# Patient Record
Sex: Male | Born: 1945 | Race: White | Hispanic: No | Marital: Married | Smoking: Never smoker
Health system: Southern US, Community
[De-identification: ages and names within clinical notes are randomized; demographics above are authoritative.]

## PROBLEM LIST (undated history)

## (undated) DIAGNOSIS — M779 Enthesopathy, unspecified: Secondary | ICD-10-CM

## (undated) DIAGNOSIS — K219 Gastro-esophageal reflux disease without esophagitis: Secondary | ICD-10-CM

## (undated) DIAGNOSIS — K603 Anal fistula, unspecified: Secondary | ICD-10-CM

## (undated) DIAGNOSIS — I2510 Atherosclerotic heart disease of native coronary artery without angina pectoris: Secondary | ICD-10-CM

## (undated) DIAGNOSIS — Z8719 Personal history of other diseases of the digestive system: Secondary | ICD-10-CM

## (undated) DIAGNOSIS — I10 Essential (primary) hypertension: Secondary | ICD-10-CM

## (undated) DIAGNOSIS — Z8669 Personal history of other diseases of the nervous system and sense organs: Secondary | ICD-10-CM

## (undated) DIAGNOSIS — M1990 Unspecified osteoarthritis, unspecified site: Secondary | ICD-10-CM

## (undated) DIAGNOSIS — Z973 Presence of spectacles and contact lenses: Secondary | ICD-10-CM

## (undated) DIAGNOSIS — I493 Ventricular premature depolarization: Secondary | ICD-10-CM

## (undated) DIAGNOSIS — N281 Cyst of kidney, acquired: Secondary | ICD-10-CM

## (undated) HISTORY — PX: CARDIOVASCULAR STRESS TEST: SHX262

## (undated) HISTORY — PX: TONSILLECTOMY: SUR1361

## (undated) HISTORY — PX: CATARACT EXTRACTION W/ INTRAOCULAR LENS  IMPLANT, BILATERAL: SHX1307

---

## 1999-01-21 ENCOUNTER — Emergency Department (HOSPITAL_COMMUNITY): Admission: EM | Admit: 1999-01-21 | Discharge: 1999-01-21 | Payer: Self-pay | Admitting: Emergency Medicine

## 1999-01-21 ENCOUNTER — Encounter: Payer: Self-pay | Admitting: Emergency Medicine

## 2004-04-25 ENCOUNTER — Encounter: Admission: RE | Admit: 2004-04-25 | Discharge: 2004-04-25 | Payer: Self-pay | Admitting: Internal Medicine

## 2004-04-25 IMAGING — CT CT ABDOMEN W/ CM
1 of 2 series · 15 of 32 positions shown, 19 images · IV contrast (gastro & omnipaque [ID])
Comparison: none

CLINICAL DATA: Abdominal pain, particularly left upper quadrant.  
 CT ABDOMEN WITH CONTRAST:
 Multidetector helical scans through the abdomen were performed after oral and IV contrast media were given.  100 cc of Omnipaque 300 were given as the contrast media.
 The lung bases are clear.  The liver enhances normally with no focal abnormality, and no ductal dilatation is seen.  No calcified gallstones are noted. The pancreas is normal in size with normal peripancreatic fat planes.  The adrenal glands appear normal, however, there are multiple rounded, low attenuation structures involving the kidneys, left greater than right, most consistent with cysts.  No definite solid renal lesion is seen.  The spleen is normal in size. The abdominal aorta is normal in caliber.   No adenopathy is seen.  The appendix is visualized in the right lower quadrant and fills normally with air and contrast.  No abnormality of the bowel is seen.  Minimal irregularity is noted to one or two posterior left lower ribs, which could represent nondisplaced fractures.

[Series 2: — · axial · 0.70mm/px · z∈[-268,+27]mm · 15 of 65 slices shown, 19 images]
[im 3/65  soft-tissue]
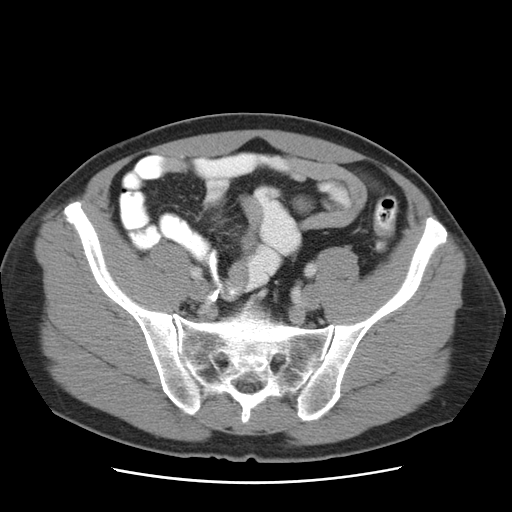
[im 3/65  bone]
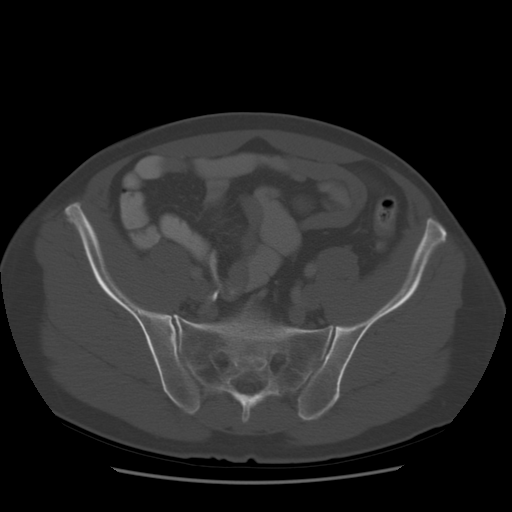
[im 9/65  soft-tissue]
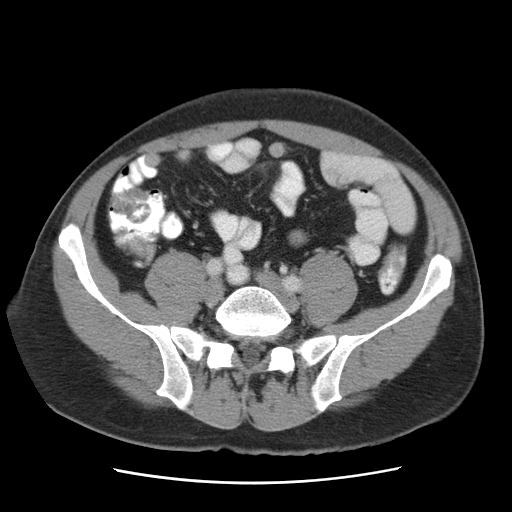
[im 15/65  soft-tissue]
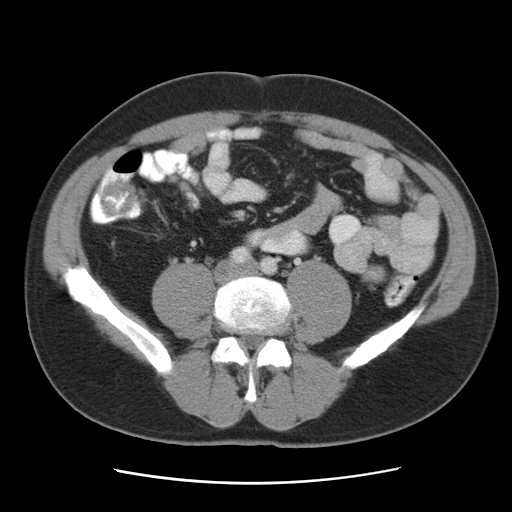
[im 18/65  soft-tissue]
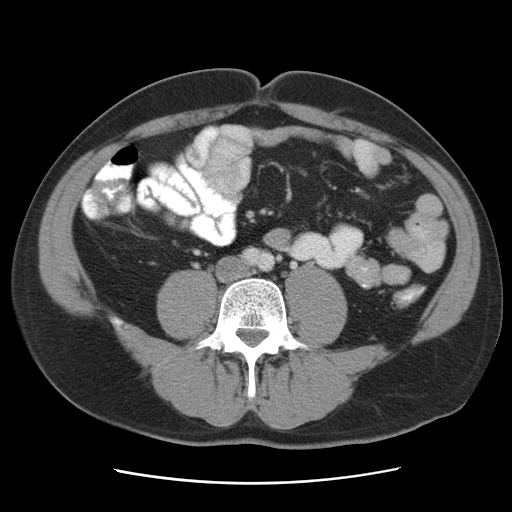
[im 24/65  soft-tissue]
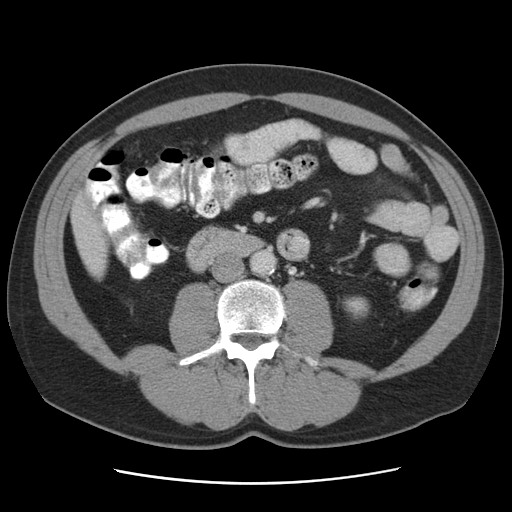
[im 27/65  soft-tissue]
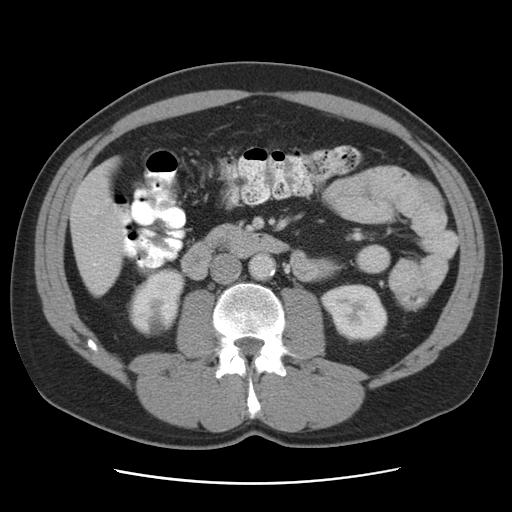
[im 33/65  soft-tissue]
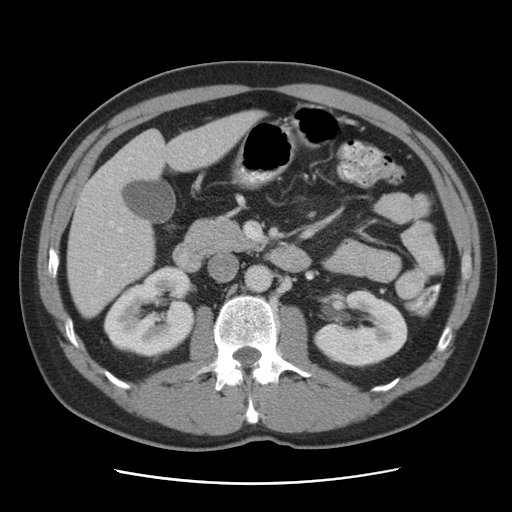
[im 38/65  soft-tissue]
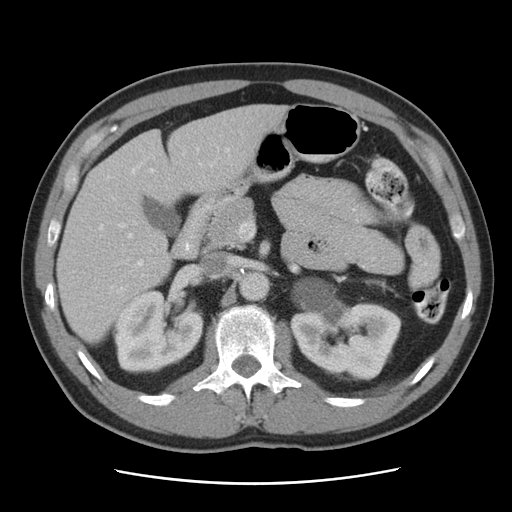
[im 41/65  soft-tissue]
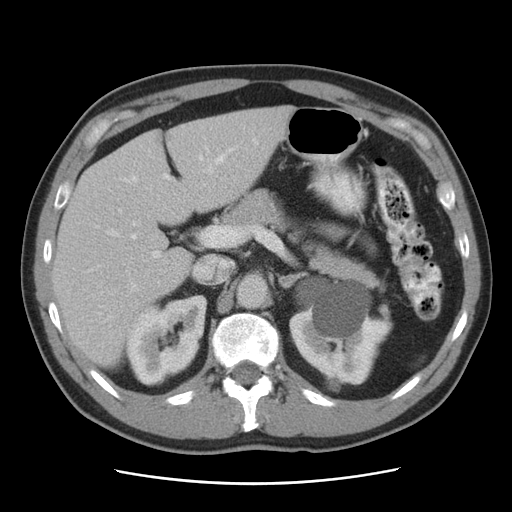
[im 41/65  bone]
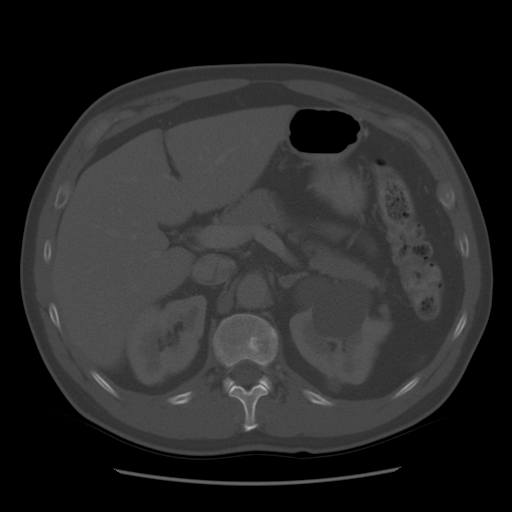
[im 47/65  soft-tissue]
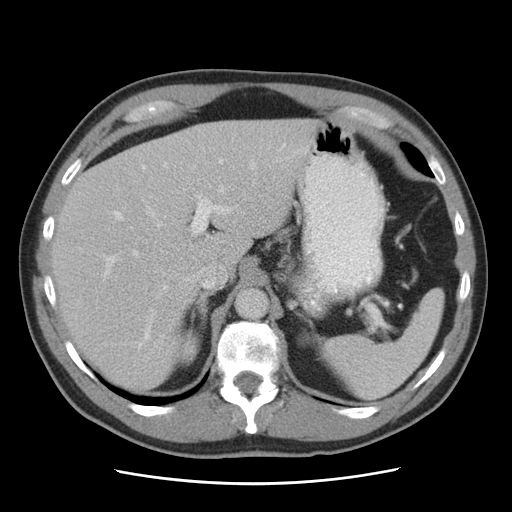
[im 50/65  soft-tissue]
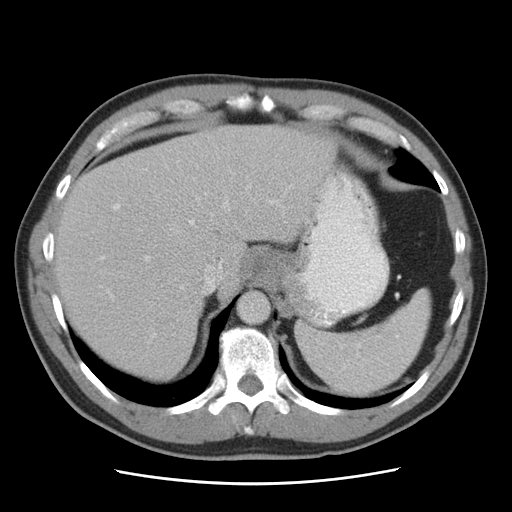
[im 53/65  lung]
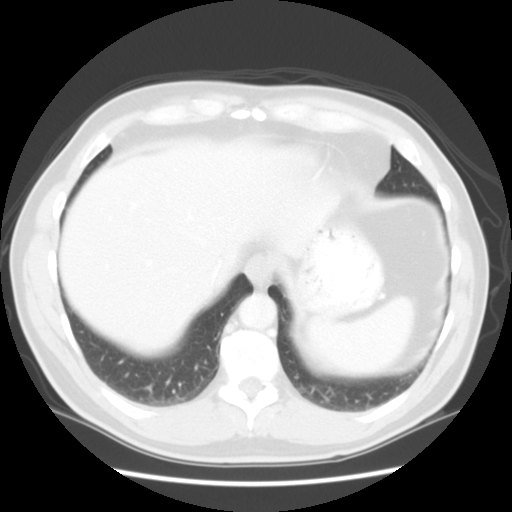
[im 56/65  soft-tissue]
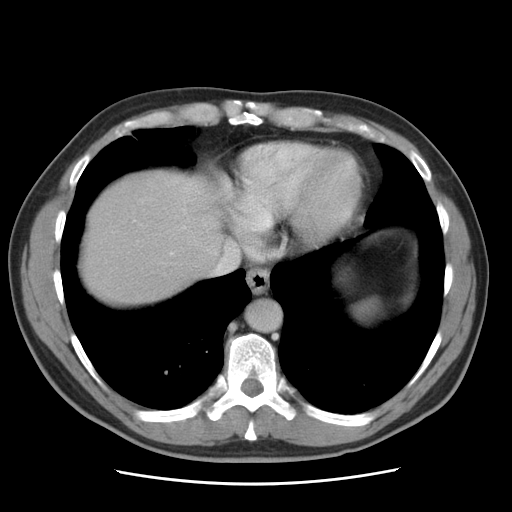
[im 56/65  lung]
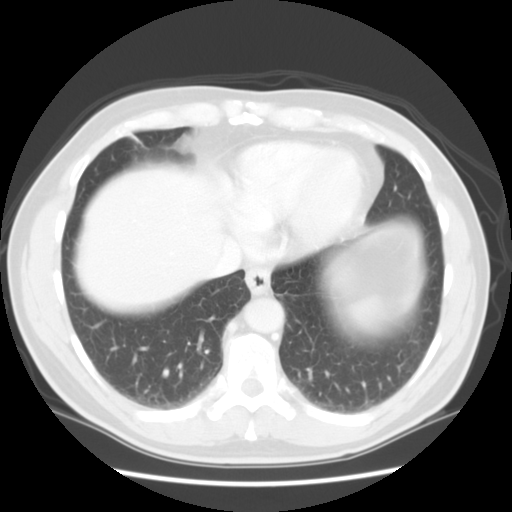
[im 59/65  lung]
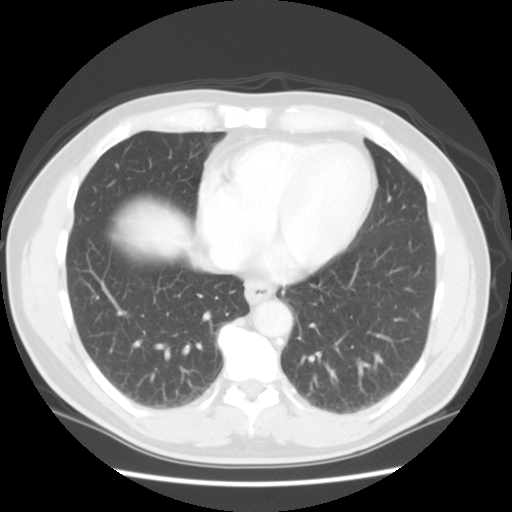
[im 62/65  soft-tissue]
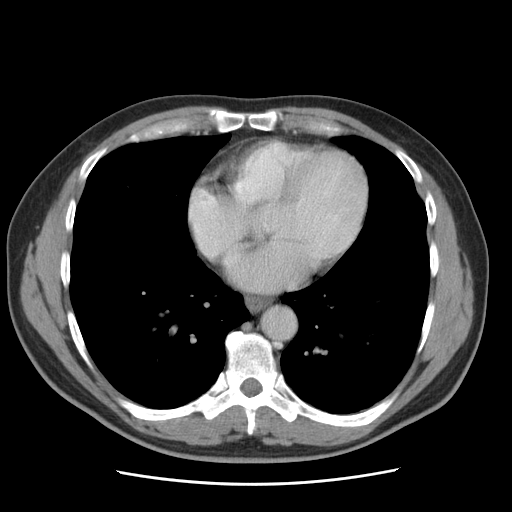
[im 62/65  lung]
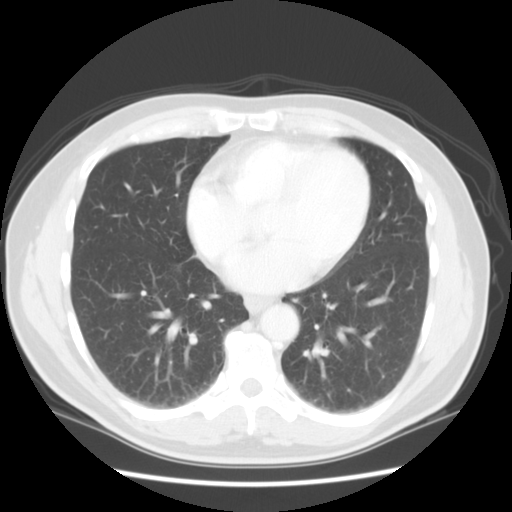

[15 of 32 positions shown; findings below may reference images not displayed]

IMPRESSION: 1.   No acute intraabdominal abnormality.
 2.  Multiple renal cysts, left greater than right, in size and number.
 3.  Cannot exclude nondisplaced left lower posterior rib fracture of uncertain age.

## 2011-10-01 ENCOUNTER — Other Ambulatory Visit: Payer: Self-pay | Admitting: Internal Medicine

## 2013-09-09 HISTORY — PX: EYE SURGERY: SHX253

## 2013-10-05 ENCOUNTER — Other Ambulatory Visit: Payer: Self-pay | Admitting: Internal Medicine

## 2013-10-05 ENCOUNTER — Ambulatory Visit
Admission: RE | Admit: 2013-10-05 | Discharge: 2013-10-05 | Disposition: A | Discharge status: Home / Self Care | Payer: Medicare Other | Source: Ambulatory Visit | Attending: Internal Medicine | Admitting: Internal Medicine

## 2013-10-05 DIAGNOSIS — M25512 Pain in left shoulder: Secondary | ICD-10-CM

## 2013-10-08 ENCOUNTER — Emergency Department (HOSPITAL_COMMUNITY): Payer: Medicare Other

## 2013-10-08 ENCOUNTER — Encounter (HOSPITAL_COMMUNITY): Payer: Self-pay | Admitting: Emergency Medicine

## 2013-10-08 ENCOUNTER — Observation Stay (HOSPITAL_COMMUNITY)
Admission: EM | Admit: 2013-10-08 | Discharge: 2013-10-09 | Disposition: A | Discharge status: Home / Self Care | Payer: Medicare Other | Attending: Internal Medicine | Admitting: Internal Medicine

## 2013-10-08 DIAGNOSIS — G454 Transient global amnesia: Secondary | ICD-10-CM | POA: Diagnosis not present

## 2013-10-08 DIAGNOSIS — R413 Other amnesia: Secondary | ICD-10-CM | POA: Diagnosis present

## 2013-10-08 DIAGNOSIS — I10 Essential (primary) hypertension: Secondary | ICD-10-CM | POA: Insufficient documentation

## 2013-10-08 DIAGNOSIS — K219 Gastro-esophageal reflux disease without esophagitis: Secondary | ICD-10-CM | POA: Diagnosis not present

## 2013-10-08 DIAGNOSIS — R42 Dizziness and giddiness: Secondary | ICD-10-CM | POA: Insufficient documentation

## 2013-10-08 DIAGNOSIS — F440 Dissociative amnesia: Secondary | ICD-10-CM

## 2013-10-08 DIAGNOSIS — E785 Hyperlipidemia, unspecified: Secondary | ICD-10-CM

## 2013-10-08 DIAGNOSIS — D72829 Elevated white blood cell count, unspecified: Secondary | ICD-10-CM

## 2013-10-08 DIAGNOSIS — I639 Cerebral infarction, unspecified: Secondary | ICD-10-CM

## 2013-10-08 DIAGNOSIS — E871 Hypo-osmolality and hyponatremia: Secondary | ICD-10-CM | POA: Diagnosis present

## 2013-10-08 HISTORY — DX: Enthesopathy, unspecified: M77.9

## 2013-10-08 HISTORY — DX: Essential (primary) hypertension: I10

## 2013-10-08 HISTORY — DX: Gastro-esophageal reflux disease without esophagitis: K21.9

## 2013-10-08 LAB — DIFFERENTIAL
Basophils Absolute: 0 10*3/uL (ref 0.0–0.1)
Basophils Relative: 0 % (ref 0–1)
Eosinophils Absolute: 0 10*3/uL (ref 0.0–0.7)
Eosinophils Relative: 0 % (ref 0–5)
Lymphocytes Relative: 12 % (ref 12–46)
Lymphs Abs: 1.4 10*3/uL (ref 0.7–4.0)
MONOS PCT: 7 % (ref 3–12)
Monocytes Absolute: 0.9 10*3/uL (ref 0.1–1.0)
NEUTROS PCT: 81 % — AB (ref 43–77)
Neutro Abs: 9.5 10*3/uL — ABNORMAL HIGH (ref 1.7–7.7)

## 2013-10-08 LAB — CBC
HCT: 43.4 % (ref 39.0–52.0)
Hemoglobin: 15.3 g/dL (ref 13.0–17.0)
MCH: 33.6 pg (ref 26.0–34.0)
MCHC: 35.3 g/dL (ref 30.0–36.0)
MCV: 95.4 fL (ref 78.0–100.0)
PLATELETS: 333 10*3/uL (ref 150–400)
RBC: 4.55 MIL/uL (ref 4.22–5.81)
RDW: 11.9 % (ref 11.5–15.5)
WBC: 11.8 10*3/uL — AB (ref 4.0–10.5)

## 2013-10-08 LAB — COMPREHENSIVE METABOLIC PANEL
ALT: 51 U/L (ref 0–53)
AST: 35 U/L (ref 0–37)
Albumin: 4.5 g/dL (ref 3.5–5.2)
Alkaline Phosphatase: 79 U/L (ref 39–117)
Anion gap: 16 — ABNORMAL HIGH (ref 5–15)
BUN: 20 mg/dL (ref 6–23)
CALCIUM: 9.9 mg/dL (ref 8.4–10.5)
CO2: 23 meq/L (ref 19–32)
CREATININE: 0.92 mg/dL (ref 0.50–1.35)
Chloride: 95 mEq/L — ABNORMAL LOW (ref 96–112)
GFR, EST NON AFRICAN AMERICAN: 86 mL/min — AB (ref >90)
Glucose, Bld: 119 mg/dL — ABNORMAL HIGH (ref 70–99)
Potassium: 4 mEq/L (ref 3.7–5.3)
SODIUM: 134 meq/L — AB (ref 137–147)
TOTAL PROTEIN: 7.8 g/dL (ref 6.0–8.3)
Total Bilirubin: 0.8 mg/dL (ref 0.3–1.2)

## 2013-10-08 LAB — I-STAT CHEM 8, ED
BUN: 20 mg/dL (ref 6–23)
Calcium, Ion: 1.16 mmol/L (ref 1.13–1.30)
Chloride: 100 mEq/L (ref 96–112)
Creatinine, Ser: 1 mg/dL (ref 0.50–1.35)
Glucose, Bld: 117 mg/dL — ABNORMAL HIGH (ref 70–99)
HEMATOCRIT: 49 % (ref 39.0–52.0)
Hemoglobin: 16.7 g/dL (ref 13.0–17.0)
POTASSIUM: 3.8 meq/L (ref 3.7–5.3)
Sodium: 135 mEq/L — ABNORMAL LOW (ref 137–147)
TCO2: 24 mmol/L (ref 0–100)

## 2013-10-08 LAB — URINALYSIS, ROUTINE W REFLEX MICROSCOPIC
Bilirubin Urine: NEGATIVE
Glucose, UA: NEGATIVE mg/dL
Hgb urine dipstick: NEGATIVE
KETONES UR: NEGATIVE mg/dL
LEUKOCYTES UA: NEGATIVE
NITRITE: NEGATIVE
Protein, ur: NEGATIVE mg/dL
Specific Gravity, Urine: 1.028 (ref 1.005–1.030)
Urobilinogen, UA: 0.2 mg/dL (ref 0.0–1.0)
pH: 6 (ref 5.0–8.0)

## 2013-10-08 LAB — RAPID URINE DRUG SCREEN, HOSP PERFORMED
Amphetamines: NOT DETECTED
BARBITURATES: NOT DETECTED
Benzodiazepines: NOT DETECTED
Cocaine: NOT DETECTED
Opiates: POSITIVE — AB
Tetrahydrocannabinol: NOT DETECTED

## 2013-10-08 LAB — ETHANOL: ALCOHOL ETHYL (B): 13 mg/dL — AB (ref 0–11)

## 2013-10-08 LAB — I-STAT TROPONIN, ED: Troponin i, poc: 0.01 ng/mL (ref 0.00–0.08)

## 2013-10-08 LAB — PROTIME-INR
INR: 0.83 (ref 0.00–1.49)
PROTHROMBIN TIME: 11.4 s — AB (ref 11.6–15.2)

## 2013-10-08 LAB — CBG MONITORING, ED: Glucose-Capillary: 116 mg/dL — ABNORMAL HIGH (ref 70–99)

## 2013-10-08 LAB — TROPONIN I: Troponin I: <0.3 ng/mL (ref <0.30)

## 2013-10-08 LAB — APTT: APTT: 29 s (ref 24–37)

## 2013-10-08 MED ORDER — ENOXAPARIN SODIUM 40 MG/0.4ML ~~LOC~~ SOLN
40.0000 mg | SUBCUTANEOUS | Status: DC
  Administered 2013-10-08: 40 mg via SUBCUTANEOUS
  Filled 2013-10-08 (×2): qty 0.4

## 2013-10-08 MED ORDER — NAPROXEN SODIUM 220 MG PO TABS: 220.0000 mg | ORAL_TABLET | Freq: Two times a day (BID) | ORAL | Status: DC | PRN

## 2013-10-08 MED ORDER — LOSARTAN POTASSIUM 50 MG PO TABS
100.0000 mg | ORAL_TABLET | Freq: Every day | ORAL | Status: DC
  Administered 2013-10-09: 100 mg via ORAL
  Filled 2013-10-08: qty 2

## 2013-10-08 MED ORDER — NAPROXEN 250 MG PO TABS
250.0000 mg | ORAL_TABLET | Freq: Two times a day (BID) | ORAL | Status: DC | PRN
  Filled 2013-10-08: qty 1

## 2013-10-08 MED ORDER — PANTOPRAZOLE SODIUM 40 MG PO TBEC
40.0000 mg | DELAYED_RELEASE_TABLET | Freq: Every day | ORAL | Status: DC
  Administered 2013-10-09: 40 mg via ORAL
  Filled 2013-10-08: qty 1

## 2013-10-08 MED ORDER — STROKE: EARLY STAGES OF RECOVERY BOOK
Freq: Once | Status: AC
  Administered 2013-10-09: 08:00:00
  Filled 2013-10-08 (×2): qty 1

## 2013-10-08 MED ORDER — ASPIRIN 325 MG PO TABS
325.0000 mg | ORAL_TABLET | Freq: Every day | ORAL | Status: DC
  Administered 2013-10-08 – 2013-10-09 (×2): 325 mg via ORAL
  Filled 2013-10-08 (×2): qty 1

## 2013-10-08 MED ORDER — SENNOSIDES-DOCUSATE SODIUM 8.6-50 MG PO TABS
2.0000 | ORAL_TABLET | Freq: Every evening | ORAL | Status: DC | PRN
  Filled 2013-10-08: qty 2

## 2013-10-08 MED ORDER — ASPIRIN 300 MG RE SUPP
300.0000 mg | Freq: Every day | RECTAL | Status: DC
  Filled 2013-10-08: qty 1

## 2013-10-08 MED ORDER — LOSARTAN POTASSIUM-HCTZ 100-12.5 MG PO TABS: 1.0000 | ORAL_TABLET | Freq: Every morning | ORAL | Status: DC

## 2013-10-08 MED ORDER — SODIUM CHLORIDE 0.9 % IV SOLN
INTRAVENOUS | Status: AC
  Administered 2013-10-08: 21:00:00 via INTRAVENOUS

## 2013-10-08 MED ORDER — HYDROCHLOROTHIAZIDE 12.5 MG PO CAPS
12.5000 mg | ORAL_CAPSULE | Freq: Every day | ORAL | Status: DC
  Administered 2013-10-09: 12.5 mg via ORAL
  Filled 2013-10-08: qty 1

## 2013-10-08 NOTE — ED Notes (Signed)
Patient transported to CT 

## 2013-10-08 NOTE — ED Notes (Addendum)
Family member states about 20 mins ago, pt began to have memory loss of immediate information , face turning red and excessive sweating. Pt states he just feels lightheaded. Family member states pt has asked about his wallet 10 times since they have been here and she has answered every time.  Pt strength equal bilaterally in upper and lower extremities, face symmetrical, no drift in upper or lower extremities.

## 2013-10-08 NOTE — ED Notes (Signed)
Attempted to call report, RN unavailable. Floor RN to call back 

## 2013-10-08 NOTE — ED Notes (Signed)
[facility] called for transport.  

## 2013-10-08 NOTE — ED Notes (Signed)
MD at bedside. 

## 2013-10-08 NOTE — ED Provider Notes (Signed)
CSN: [id number]     Arrival date & time 10/08/13  1417 History   First MD Initiated Contact with Patient 10/08/13 1452     Chief Complaint  Patient presents with  . Memory Loss  . Dizziness  . Excessive Sweating     (Consider location/radiation/quality/duration/timing/severity/associated sxs/prior Treatment) HPI 68 year old male with history of hypertension without history of stroke or confusion previously was last known well at 2:00 this afternoon when he was at home talking to his wife and he developed sudden short-term memory loss consistent with global amnesia suddenly not recalling orientation to time or recent history for the last few days. Patient has no headache no change in speech vision swallowing or understanding and no focal or lateralizing weakness numbness incoordination or vertigo. He has clear speech and is walking normally. There is no treatment prior to arrival. Past Medical History  Diagnosis Date  . Hypertension   . GERD (gastroesophageal reflux disease)   . Retinal detachment   . Tendonitis     left shoulder   Past Surgical History  Procedure Laterality Date  . Eye surgery  09/09/2013    retinal detachment surgery  . Tonsillectomy    . Left shoulder injection  10/07/13   Family History  Problem Relation Age of Onset  . Stroke Neg Hx   . Heart disease Father   . Depression Mother     with previous suicide attempt  . Bipolar disorder Brother   . Alcoholism Paternal Uncle   . Cancer Neg Hx    History  Substance Use Topics  . Smoking status: Never Smoker   . Smokeless tobacco: Not on file  . Alcohol Use: Yes     Comment: 2-3 beers per day and glass of wine    Review of Systems  10 Systems reviewed and are negative for acute change except as noted in the HPI.  Allergies  Review of patient's allergies indicates no known allergies.  Home Medications   Prior to Admission medications   Medication Sig Start Date End Date Taking? Authorizing Provider   losartan-hydrochlorothiazide (HYZAAR) 100-12.5 MG per tablet Take 1 tablet by mouth every morning.   Yes Historical Provider, MD  naproxen sodium (ANAPROX) 220 MG tablet Take 220 mg by mouth 2 (two) times daily as needed (pain).   Yes Historical Provider, MD  omeprazole (PRILOSEC) 20 MG capsule Take 20 mg by mouth every morning.   Yes Historical Provider, MD  aspirin EC 81 MG tablet Take 1 tablet (81 mg total) by mouth daily. 10/09/13   Ripudeep Jenna Luo, MD  indomethacin (INDOCIN) 50 MG capsule Take 50 mg by mouth 2 (two) times daily with a meal.    Historical Provider, MD  simvastatin (ZOCOR) 5 MG tablet Take 1 tablet (5 mg total) by mouth at bedtime. 10/09/13   Ripudeep Jenna Luo, MD  traMADol (ULTRAM) 50 MG tablet Take 1 tablet (50 mg total) by mouth every 8 (eight) hours as needed for severe pain. 10/09/13   Ripudeep Jenna Luo, MD   BP 158/85  Pulse 59  Temp(Src) 99.3 F (37.4 C) (Oral)  Resp 20  Ht 5\' 9"  (1.753 m)  Wt 167 lb 12.3 oz (76.1 kg)  BMI 24.76 kg/m2  SpO2 100% Physical Exam  Nursing note and vitals reviewed. Constitutional:  Awake, alert, nontoxic appearance with baseline speech for patient.  HENT:  Head: Atraumatic.  Mouth/Throat: No oropharyngeal exudate.  Eyes: EOM are normal. Pupils are equal, round, and reactive to light. Right eye  exhibits no discharge. Left eye exhibits no discharge.  Neck: Neck supple.  Cardiovascular: Normal rate and regular rhythm.   No murmur heard. Pulmonary/Chest: Effort normal and breath sounds normal. No stridor. No respiratory distress. He has no wheezes. He has no rales. He exhibits no tenderness.  Abdominal: Soft. Bowel sounds are normal. He exhibits no mass. There is no tenderness. There is no rebound.  Musculoskeletal: He exhibits no tenderness.  Baseline ROM, moves extremities with no obvious new focal weakness.  Lymphadenopathy:    He has no cervical adenopathy.  Neurological: He is alert.  Awake, alert, cooperative and aware of situation;  motor strength 5/5 bilaterally; sensation normal to light touch bilaterally; peripheral visual fields full to confrontation; no facial asymmetry; tongue midline; major cranial nerves appear intact; no pronator drift, normal finger to nose bilaterally, baseline gait without new ataxia. Patient is oriented to person and place but not to time but he does realize he has short-term memory loss. He has no idea what day of the week it is what month it is. He does not recall events for the last several days.  Skin: No rash noted.  Psychiatric: He has a normal mood and affect.    ED Course  Procedures (including critical care time) D/w [facility] rec[facility] at [facility]; agrees likely transient global amnesia; not Code Stroke tPA candidate. 1510 D/w Pt and family [facility] paged. 1720 Patient / Family / Caregiver informed of clinical course, understand medical decision-making process, and agree with plan.Pt stable in ED with no significant deterioration in condition. Labs Review Labs Reviewed  COMPREHENSIVE METABOLIC PANEL - Abnormal; Notable for the following:    Sodium 134 (*)    Chloride 95 (*)    Glucose, Bld 119 (*)    GFR calc non Af Amer 86 (*)    Anion gap 16 (*)    All other components within normal limits  CBC - Abnormal; Notable for the following:    WBC 11.8 (*)    All other components within normal limits  ETHANOL - Abnormal; Notable for the following:    Alcohol, Ethyl (B) 13 (*)    All other components within normal limits  PROTIME-INR - Abnormal; Notable for the following:    Prothrombin Time 11.4 (*)    All other components within normal limits  URINE RAPID DRUG SCREEN (HOSP PERFORMED) - Abnormal; Notable for the following:    Opiates POSITIVE (*)    All other components within normal limits  URINALYSIS, ROUTINE W REFLEX MICROSCOPIC - Abnormal; Notable for the following:    Color, Urine AMBER (*)    All other components within normal limits  DIFFERENTIAL - Abnormal; Notable for the  following:    Neutrophils Relative % 81 (*)    Neutro Abs 9.5 (*)    All other components within normal limits  HEMOGLOBIN A1C - Abnormal; Notable for the following:    Hemoglobin A1C 5.7 (*)    Mean Plasma Glucose 117 (*)    All other components within normal limits  BASIC METABOLIC PANEL - Abnormal; Notable for the following:    Glucose, Bld 112 (*)    GFR calc non Af Amer 87 (*)    All other components within normal limits  CBC - Abnormal; Notable for the following:    RBC 4.15 (*)    All other components within normal limits  LIPID PANEL - Abnormal; Notable for the following:    Cholesterol 251 (*)    Triglycerides 233 (*)  VLDL 47 (*)    LDL Cholesterol 127 (*)    All other components within normal limits  CBG MONITORING, ED - Abnormal; Notable for the following:    Glucose-Capillary 116 (*)    All other components within normal limits  I-STAT CHEM 8, ED - Abnormal; Notable for the following:    Sodium 135 (*)    Glucose, Bld 117 (*)    All other components within normal limits  APTT  TROPONIN I  TROPONIN I  TROPONIN I  I-STAT TROPOININ, ED  [place] SensorI-STAT TROPOININ, ED    Imaging Review Dg Chest 2 View  10/09/2013   CLINICAL DATA:  Stroke  EXAM: CHEST  2 VIEW  COMPARISON:  None.  FINDINGS: Cardiomediastinal silhouette is unremarkable. No acute infiltrate or pleural effusion. No pulmonary edema. Degenerative changes thoracic spine. Probable bilateral basilar nodular nipple shadow. Repeat frontal view with nipple markers is recommended for confirmation.  IMPRESSION: No acute infiltrate or pleural effusion. No pulmonary edema. Degenerative changes thoracic spine. Probable bilateral basilar nodular nipple shadow. Repeat frontal view with nipple markers is recommended for confirmation.   Electronically Signed   By: Natasha MeadLiviu  Pop M.D.   On: 10/09/2013 09:57   Ct Head Wo Contrast  10/08/2013   CLINICAL DATA:  Memory loss, dizziness  EXAM: CT HEAD WITHOUT CONTRAST  TECHNIQUE: Contiguous  axial images were obtained from the base of the skull through the vertex without intravenous contrast.  COMPARISON:  None  FINDINGS: There is no evidence of mass effect, midline shift, or extra-axial fluid collections. There is no evidence of a space-occupying lesion or intracranial hemorrhage. There is no evidence of a cortical-based area of acute infarction. There is generalized cerebral atrophy. There is periventricular white matter low attenuation likely secondary to microangiopathy.  The ventricles and sulci are appropriate for the patient's age. The basal cisterns are patent.  Visualized portions of the orbits are unremarkable. The visualized portions of the paranasal sinuses and mastoid air cells are unremarkable.  The osseous structures are unremarkable.  IMPRESSION: No acute intracranial pathology.   Electronically Signed   By: Elige KoHetal  Patel   On: 10/08/2013 15:31   Mr Brain Wo Contrast  10/09/2013   CLINICAL DATA:  Episode of memory loss.  Possible TIA.  EXAM: MRI HEAD WITHOUT CONTRAST  MRA HEAD WITHOUT CONTRAST  TECHNIQUE: Multiplanar, multiecho pulse sequences of the brain and surrounding structures were obtained without intravenous contrast. Angiographic images of the head were obtained using MRA technique without contrast.  COMPARISON:  CT head without contrast 10/08/2013  FINDINGS: MRI HEAD FINDINGS  The diffusion-weighted images demonstrate no evidence for acute or subacute infarction. No hemorrhage or mass lesion is present. Midline structures are normal. Mild atrophy and minimal periventricular white matter changes are noted bilaterally. The ventricles are of normal size. No significant extraaxial fluid collection is present.  Flow is present in the major intracranial arteries. The paranasal sinuses and mastoid air cells are clear. The patient is status post bilateral lens replacements.  MRA HEAD FINDINGS  The internal carotid arteries are within normal limits from the high cervical segments  through the ICA termini bilaterally. The A1 and M1 segments are normal. The anterior communicating artery is present. The MCA bifurcations are intact. There is some attenuation of distal MCA branch vessels bilaterally. No significant proximal stenosis or occlusion is present.  The right vertebral artery is the dominant vessel. The right PICA origin is visualized and normal. The left AICA is dominant. The basilar artery is  within normal limits. Both posterior cerebral arteries originate from the basilar tip. There is some attenuation of distal PCA branch vessels.  IMPRESSION: 1. No acute intracranial abnormality. 2. Mild small vessel disease evident with T2 periventricular T2 changes and distal attenuation of branch vessels on the MRA. 3. No significant proximal stenosis, aneurysm, or branch vessel occlusion.   Electronically Signed   By: Gennette Pac M.D.   On: 10/09/2013 13:52   Mr Maxine Glenn Head/brain Wo Cm  10/09/2013   CLINICAL DATA:  Episode of memory loss.  Possible TIA.  EXAM: MRI HEAD WITHOUT CONTRAST  MRA HEAD WITHOUT CONTRAST  TECHNIQUE: Multiplanar, multiecho pulse sequences of the brain and surrounding structures were obtained without intravenous contrast. Angiographic images of the head were obtained using MRA technique without contrast.  COMPARISON:  CT head without contrast 10/08/2013  FINDINGS: MRI HEAD FINDINGS  The diffusion-weighted images demonstrate no evidence for acute or subacute infarction. No hemorrhage or mass lesion is present. Midline structures are normal. Mild atrophy and minimal periventricular white matter changes are noted bilaterally. The ventricles are of normal size. No significant extraaxial fluid collection is present.  Flow is present in the major intracranial arteries. The paranasal sinuses and mastoid air cells are clear. The patient is status post bilateral lens replacements.  MRA HEAD FINDINGS  The internal carotid arteries are within normal limits from the high cervical  segments through the ICA termini bilaterally. The A1 and M1 segments are normal. The anterior communicating artery is present. The MCA bifurcations are intact. There is some attenuation of distal MCA branch vessels bilaterally. No significant proximal stenosis or occlusion is present.  The right vertebral artery is the dominant vessel. The right PICA origin is visualized and normal. The left AICA is dominant. The basilar artery is within normal limits. Both posterior cerebral arteries originate from the basilar tip. There is some attenuation of distal PCA branch vessels.  IMPRESSION: 1. No acute intracranial abnormality. 2. Mild small vessel disease evident with T2 periventricular T2 changes and distal attenuation of branch vessels on the MRA. 3. No significant proximal stenosis, aneurysm, or branch vessel occlusion.   Electronically Signed   By: Gennette Pac M.D.   On: 10/09/2013 13:52     EKG Interpretation   Date/Time:  Thursday October 08 2013 14:32:04 EDT Ventricular Rate:  68 PR Interval:  156 QRS Duration: 104 QT Interval:  390 QTC Calculation: 415 R Axis:   -35 Text Interpretation:  Sinus rhythm Left axis deviation No previous ECGs  available Confirmed by [facility]  MD, Jonny Ruiz ([id number]) on 10/08/2013 3:20:52 PM      MDM   Final diagnoses:  Global amnesia    The patient appears reasonably stabilized for transfer considering the current resources, flow, and capabilities available in the ED at this time, and I doubt any other [facility] requiring further screening and/or treatment in the ED prior to transfer.    Hurman Horn, MD 10/09/13 2113

## 2013-10-08 NOTE — Consult Note (Signed)
NEURO HOSPITALIST CONSULT NOTE    Reason for Consult: transient memory loss  HPI:                                                                                                                                          Adam Mckenzie is an 68 y.o. male with a past medical history significant for HTN, retinal detachment, GERD, transferred to [facility] after sustaining a transient episode of memory dysfunction. He was initially evaluated at [facility] and sent to this hospital for further evaluation and management. Clinical data is gathered from the chart as Adam Mckenzie said that he doesn't recalls what happened this afternoon, " I have a short gap in my memory". He remembers telling his wife that he was feeling " weird" which he believes was enough to make his wife very concerned.  " History is obtained mostly from his wife he states that he was on the phone with [place] tax office trying to sort out tax payments around 2 PM. He had a normal conversation on the phone, however when he hung up and she asked him what they discussed, and she had a long pause and then finally answered that he did not remember. He was flushed and diaphoretic. He did not have any facial droop, slurred speech, weakness of an arm or leg. His wife realized that he did not know the month, year, dogs name, or the fact that he had a steroid injection of his left shoulder yesterday. She brought him urgently to the emergency department because of concern for stroke. Initial assessment in the emergency department was consistent with her report. He did not remember any events over the last several weeks. Initially, he repeatedly asked his wife why he had IVs placed in his arm, what his armband was, where he was, etc. According to his wife, he has had extraordinary stress over the last year with the loss of his job, difficulty with finances, living next to his mother who has some mental health issues. He has not had any problems  previously with drug use however for the last 2 days he has been taking hydrocodone. He drinks approximately 3 alcoholic beverages per day and has never had withdrawal from alcohol. He had 2 sips of line with lunch prior to making his phone call to [place] and did not have any signs of alcohol intoxication at the time that this happened." Initial work up in the ED: unrevealing serologies and CT head unremarkable. At this moment, he denies HA, vertigo, double vision, focal weakness or numbness, slurred speech, language or vision impairment.   Past Medical History  Diagnosis Date  . Hypertension   . GERD (gastroesophageal reflux disease)   . Retinal detachment   . Tendonitis  left shoulder    Past Surgical History  Procedure Laterality Date  . Eye surgery  09/09/2013    retinal detachment surgery  . Tonsillectomy    . Left shoulder injection  10/07/13    Family History  Problem Relation Age of Onset  . Stroke Neg Hx   . Heart disease Father   . Depression Mother     with previous suicide attempt  . Bipolar disorder Brother   . Alcoholism Paternal Uncle   . Cancer Neg Hx     Social History:  reports that he has never smoked. He does not have any smokeless tobacco history on file. He reports that he drinks alcohol. He reports that he does not use illicit drugs.  No Known Allergies  MEDICATIONS:                                                                                                                     Scheduled: .  stroke: mapping our early stages of recovery book   Does not apply Once  . aspirin  300 mg Rectal Daily   Or  . aspirin  325 mg Oral Daily  . enoxaparin (LOVENOX) injection  40 mg Subcutaneous Q24H  . [START ON 10/09/2013] losartan  100 mg Oral Daily   And  . [START ON 10/09/2013] hydrochlorothiazide  12.5 mg Oral Daily  . [START ON 10/09/2013] pantoprazole  40 mg Oral Daily     ROS:                                                                                                                                        History obtained from chart review and patient.  General ROS: negative for - chills, fatigue, fever, night sweats, weight gain or weight loss Psychological ROS: negative for - behavioral disorder, hallucinations, memory difficulties, mood swings or suicidal ideation Ophthalmic ROS: negative for - blurry vision, double vision, eye pain or loss of vision ENT ROS: negative for - epistaxis, nasal discharge, oral lesions, sore throat, tinnitus or vertigo Allergy and Immunology ROS: negative for - hives or itchy/watery eyes Hematological and Lymphatic ROS: negative for - bleeding problems, bruising or swollen lymph nodes Endocrine ROS: negative for - galactorrhea, hair pattern changes, polydipsia/polyuria or temperature intolerance Respiratory ROS: negative for - cough, hemoptysis, shortness of breath or wheezing Cardiovascular ROS: negative for - chest pain, dyspnea on exertion, edema or  irregular heartbeat Gastrointestinal ROS: negative for - abdominal pain, diarrhea, hematemesis, nausea/vomiting or stool incontinence Genito-Urinary ROS: negative for - dysuria, hematuria, incontinence or urinary frequency/urgency Musculoskeletal ROS: negative for - joint swelling or muscular weakness Neurological ROS: as noted in HPI Dermatological ROS: negative for rash and skin lesion changes  Physical exam: pleasant male in no apparent distress. Blood pressure 137/70, pulse 67, temperature 98.7 F (37.1 C), temperature source Oral, resp. rate 18, height 5\' 9"  (1.753 m), weight 76.1 kg (167 lb 12.3 oz), SpO2 96.00%. Head: normocephalic. Neck: supple, no bruits, no JVD. Cardiac: no murmurs. Lungs: clear. Abdomen: soft, no tender, no mass. Extremities: no edema. Neurologic Examination:                                                                                                      Mental Status: Alert, oriented, thought content appropriate.   Speech fluent without evidence of aphasia.  Able to follow 3 step commands without difficulty. Cranial Nerves: II: Discs flat bilaterally; Visual fields grossly normal, pupils equal, round, reactive to light and accommodation III,IV, VI: ptosis not present, extra-ocular motions intact bilaterally V,VII: smile symmetric, facial light touch sensation normal bilaterally VIII: hearing normal bilaterally IX,X: gag reflex present XI: bilateral shoulder shrug XII: midline tongue extension without atrophy or fasciculations  Motor: Right : Upper extremity   5/5    Left:     Upper extremity   5/5  Lower extremity   5/5     Lower extremity   5/5 Tone and bulk:normal tone throughout; no atrophy noted Sensory: Pinprick and light touch intact throughout, bilaterally Deep Tendon Reflexes:  Right: Upper Extremity   Left: Upper extremity   biceps (C-5 to C-6) 2/4   biceps (C-5 to C-6) 2/4 tricep (C7) 2/4    triceps (C7) 2/4 Brachioradialis (C6) 2/4  Brachioradialis (C6) 2/4  Lower Extremity Lower Extremity  quadriceps (L-2 to L-4) 2/4   quadriceps (L-2 to L-4) 2/4 Achilles (S1) 2/4   Achilles (S1) 2/4  Plantars: Right: downgoing   Left: downgoing Cerebellar: normal finger-to-nose,  normal heel-to-shin test Gait:  No tested    No results found for this basename: cbc, bmp, coags, chol, tri, ldl, hga1c    Results for orders placed during the hospital encounter of 10/08/13 (from the past 48 hour(s))  CBG MONITORING, ED     Status: Abnormal   Collection Time    10/08/13  2:30 PM      Result Value Ref Range   Glucose-Capillary 116 (*) 70 - 99 mg/dL   Comment 1 Documented in Chart     Comment 2 Notify RN    COMPREHENSIVE METABOLIC PANEL     Status: Abnormal   Collection Time    10/08/13  2:49 PM      Result Value Ref Range   Sodium 134 (*) 137 - 147 mEq/L   Potassium 4.0  3.7 - 5.3 mEq/L   Chloride 95 (*) 96 - 112 mEq/L   CO2 23  19 - 32 mEq/L   Glucose, Bld 119 (*) 70 - 99  mg/dL   BUN  20  6 - 23 mg/dL   Creatinine, Ser 0.92  0.50 - 1.35 mg/dL   Calcium 9.9  8.4 - 10.5 mg/dL   Total Protein 7.8  6.0 - 8.3 g/dL   Albumin 4.5  3.5 - 5.2 g/dL   AST 35  0 - 37 U/L   ALT 51  0 - 53 U/L   Alkaline Phosphatase 79  39 - 117 U/L   Total Bilirubin 0.8  0.3 - 1.2 mg/dL   GFR calc non Af Amer 86 (*) >90 mL/min   GFR calc Af Amer >90  >90 mL/min   Comment: (NOTE)     The eGFR has been calculated using the CKD EPI equation.     This calculation has not been validated in all clinical situations.     eGFR's persistently <90 mL/min signify possible Chronic Kidney     Disease.   Anion gap 16 (*) 5 - 15  CBC     Status: Abnormal   Collection Time    10/08/13  2:49 PM      Result Value Ref Range   WBC 11.8 (*) 4.0 - 10.5 K/uL   RBC 4.55  4.22 - 5.81 MIL/uL   Hemoglobin 15.3  13.0 - 17.0 g/dL   HCT 43.4  39.0 - 52.0 %   MCV 95.4  78.0 - 100.0 fL   MCH 33.6  26.0 - 34.0 pg   MCHC 35.3  30.0 - 36.0 g/dL   RDW 11.9  11.5 - 15.5 %   Platelets 333  150 - 400 K/uL  DIFFERENTIAL     Status: Abnormal   Collection Time    10/08/13  2:49 PM      Result Value Ref Range   Neutrophils Relative % 81 (*) 43 - 77 %   Neutro Abs 9.5 (*) 1.7 - 7.7 K/uL   Lymphocytes Relative 12  12 - 46 %   Lymphs Abs 1.4  0.7 - 4.0 K/uL   Monocytes Relative 7  3 - 12 %   Monocytes Absolute 0.9  0.1 - 1.0 K/uL   Eosinophils Relative 0  0 - 5 %   Eosinophils Absolute 0.0  0.0 - 0.7 K/uL   Basophils Relative 0  0 - 1 %   Basophils Absolute 0.0  0.0 - 0.1 K/uL  ETHANOL     Status: Abnormal   Collection Time    10/08/13  2:50 PM      Result Value Ref Range   Alcohol, Ethyl (B) 13 (*) 0 - 11 mg/dL   Comment:            LOWEST DETECTABLE LIMIT FOR     SERUM ALCOHOL IS 11 mg/dL     FOR MEDICAL PURPOSES ONLY  PROTIME-INR     Status: Abnormal   Collection Time    10/08/13  2:50 PM      Result Value Ref Range   Prothrombin Time 11.4 (*) 11.6 - 15.2 seconds   INR 0.83  0.00 - 1.49  APTT     Status: None    Collection Time    10/08/13  2:50 PM      Result Value Ref Range   aPTT 29  24 - 37 seconds  I-STAT TROPOININ, ED     Status: None   Collection Time    10/08/13  3:09 PM      Result Value Ref Range   Troponin i, poc 0.01  0.00 -  0.08 ng/mL   Comment 3            Comment: Due to the release kinetics of cTnI,     a negative result within the first hours     of the onset of symptoms does not rule out     myocardial infarction with certainty.     If myocardial infarction is still suspected,     repeat the test at appropriate intervals.  I-STAT CHEM 8, ED     Status: Abnormal   Collection Time    10/08/13  3:10 PM      Result Value Ref Range   Sodium 135 (*) 137 - 147 mEq/L   Potassium 3.8  3.7 - 5.3 mEq/L   Chloride 100  96 - 112 mEq/L   BUN 20  6 - 23 mg/dL   Creatinine, Ser 1.00  0.50 - 1.35 mg/dL   Glucose, Bld 117 (*) 70 - 99 mg/dL   Calcium, Ion 1.16  1.13 - 1.30 mmol/L   TCO2 24  0 - 100 mmol/L   Hemoglobin 16.7  13.0 - 17.0 g/dL   HCT 49.0  39.0 - 52.0 %  URINE RAPID DRUG SCREEN (HOSP PERFORMED)     Status: Abnormal   Collection Time    10/08/13  4:42 PM      Result Value Ref Range   Opiates POSITIVE (*) NONE DETECTED   Cocaine NONE DETECTED  NONE DETECTED   Benzodiazepines NONE DETECTED  NONE DETECTED   Amphetamines NONE DETECTED  NONE DETECTED   Tetrahydrocannabinol NONE DETECTED  NONE DETECTED   Barbiturates NONE DETECTED  NONE DETECTED   Comment:            DRUG SCREEN FOR MEDICAL PURPOSES     ONLY.  IF CONFIRMATION IS NEEDED     FOR ANY PURPOSE, NOTIFY LAB     WITHIN 5 DAYS.                LOWEST DETECTABLE LIMITS     FOR URINE DRUG SCREEN     Drug Class       Cutoff (ng/mL)     Amphetamine      1000     Barbiturate      200     Benzodiazepine   545     Tricyclics       625     Opiates          300     Cocaine          300     THC              50  URINALYSIS, ROUTINE W REFLEX MICROSCOPIC     Status: Abnormal   Collection Time    10/08/13  4:42 PM       Result Value Ref Range   Color, Urine AMBER (*) YELLOW   Comment: BIOCHEMICALS MAY BE AFFECTED BY COLOR   APPearance CLEAR  CLEAR   Specific Gravity, Urine 1.028  1.005 - 1.030   pH 6.0  5.0 - 8.0   Glucose, UA NEGATIVE  NEGATIVE mg/dL   Hgb urine dipstick NEGATIVE  NEGATIVE   Bilirubin Urine NEGATIVE  NEGATIVE   Ketones, ur NEGATIVE  NEGATIVE mg/dL   Protein, ur NEGATIVE  NEGATIVE mg/dL   Urobilinogen, UA 0.2  0.0 - 1.0 mg/dL   Nitrite NEGATIVE  NEGATIVE   Leukocytes, UA NEGATIVE  NEGATIVE   Comment: MICROSCOPIC NOT DONE ON URINES  WITH NEGATIVE PROTEIN, BLOOD, LEUKOCYTES, NITRITE, OR GLUCOSE <1000 mg/dL.  TROPONIN I     Status: None   Collection Time    10/08/13  9:25 PM      Result Value Ref Range   Troponin I <0.30  <0.30 ng/mL   Comment:            Due to the release kinetics of cTnI,     a negative result within the first hours     of the onset of symptoms does not rule out     myocardial infarction with certainty.     If myocardial infarction is still suspected,     repeat the test at appropriate intervals.    Ct Head Wo Contrast  10/08/2013   CLINICAL DATA:  Memory loss, dizziness  EXAM: CT HEAD WITHOUT CONTRAST  TECHNIQUE: Contiguous axial images were obtained from the base of the skull through the vertex without intravenous contrast.  COMPARISON:  None  FINDINGS: There is no evidence of mass effect, midline shift, or extra-axial fluid collections. There is no evidence of a space-occupying lesion or intracranial hemorrhage. There is no evidence of a cortical-based area of acute infarction. There is generalized cerebral atrophy. There is periventricular white matter low attenuation likely secondary to microangiopathy.  The ventricles and sulci are appropriate for the patient's age. The basal cisterns are patent.  Visualized portions of the orbits are unremarkable. The visualized portions of the paranasal sinuses and mastoid air cells are unremarkable.  The osseous  structures are unremarkable.  IMPRESSION: No acute intracranial pathology.   Electronically Signed   By: Kathreen Devoid   On: 10/08/2013 15:31   Assessment/Plan: 68 y/o with probable TGA. Non focal neuro-exam. Differential included TIA and partial complex seizure, which seem to be less likely. Recommend: MRI/MRA brain, EEG. Will follow up.  Dorian Pod, MD 10/08/2013, 11:38 PM [facility]

## 2013-10-08 NOTE — Progress Notes (Signed)
  CARE MANAGEMENT ED NOTE 10/08/2013  Patient:  Jacquelynn CreeOLIVER,Avaneesh M   Account Number:  [id number]  Date Initiated:  10/08/2013  Documentation initiated by:  Radford PaxFERRERO,Kandi Brusseau  Subjective/Objective Assessment:   Patient presents to Ed with memory loss, facila flushing and diaphoretic     Subjective/Objective Assessment Detail:   Patient with pmhx of HTN     Action/Plan:   Action/Plan Detail:   Anticipated DC Date:       Status Recommendation to Physician:   Result of Recommendation:    Other ED Services  Consult Working Plan    DC Planning Services  Other  PCP issues    Choice offered to / List presented to:            Status of service:  Completed, signed off  ED Comments:   ED Comments Detail:  Patient confirms his pcp is Dr. Marden Nobleobert [facility] [place].  System updated.

## 2013-10-08 NOTE — H&P (Signed)
[facility] History and Physical  Adam Mckenzie ZOX:[id number] DOB: 1945/04/26 DOA: 10/08/2013  Referring physician:  Wayland Salinas PCP:  Pearla Dubonnet, MD   Chief Complaint:  Memory loss  HPI:  The patient is a 68 y.o. year-old male with history of HTN and GERD who presents with memory loss.  The patient was last at their baseline health earlier today. History is obtained mostly from his wife he states that he was on the phone with [place] tax office trying to sort out tax payments around 2 PM. He had a normal conversation on the phone, however when he hung up and she asked him what they discussed, and she had a long pause and then finally answered that he did not remember.  He was flushed and diaphoretic.  He did not have any facial droop, slurred speech, weakness of an arm or leg.  His wife realized that he did not know the month, year, dogs name, or the fact that he had a steroid injection of his left shoulder yesterday. She brought him urgently to the emergency department because of concern for stroke.  Initial assessment in the emergency department was consistent with her report. He did not remember any events over the last several weeks.  Initially, he repeatedly asked his wife why he had IVs placed in his arm, what his armband was, where he was, etc.  According to his wife, he has had extraordinary stress over the last year with the loss of his job, difficulty with finances, living next to his mother who has some mental health issues. He has not had any problems previously with drug use however for the last 2 days he has been taking hydrocodone. He drinks approximately 3 alcoholic beverages per day and has never had withdrawal from alcohol. He had 2 sips of line with lunch prior to making his phone call to [place] and did not have any signs of alcohol intoxication at the time that this happened.  During my questioning, the patient was unaware where he was, at the month, the year. He  again did not remember about his shoulder injection yesterday. He was able to recall the names of his sons and their approximate ages. He stated that his thinking felt fuzzy, but he denied any numbness, tingling, weakness.  At the end of my exam, he was able to recall where he was and what his IVs were for.    In the emergency department, vital signs were notable for mild elevation of blood pressure 161/91. WBC 11.8, sodium 134, chloride 95, liver function tests within normal limits. Troponin negative.  EKG normal sinus rhythm. Urine drug screen positive for opiates.  CT head unremarkable.  Case was discussed with neurology who asked that he be admitted over to [facility] for further evaluation.  tPA not administered due to unclear diagnosis, low NIHSS with early clinical improvement.    Review of Systems:  General:  Denies fevers, chills, weight loss or gain HEENT:  Denies changes to hearing and vision, rhinorrhea, sinus congestion, sore throat CV:  Denies chest pain and palpitations, lower extremity edema.  PULM:  Denies SOB, wheezing, cough.   GI:  Denies nausea, vomiting, constipation, diarrhea.   GU:  Denies dysuria, frequency, urgency ENDO:  Denies polyuria, polydipsia.   HEME:  Denies hematemesis, blood in stools, melena, abnormal bruising or bleeding.  LYMPH:  Denies lymphadenopathy.   MSK:  Left shoulder pain that has since resolved with injection yesterday, denies current arthralgias, myalgias.  DERM:  Denies skin rash or ulcer.   NEURO:  Per HPI PSYCH:  Denies anxiety and depression.    Past Medical History  Diagnosis Date  . Hypertension   . GERD (gastroesophageal reflux disease)   . Retinal detachment   . Tendonitis     left shoulder   Past Surgical History  Procedure Laterality Date  . Eye surgery  09/09/2013    retinal detachment surgery  . Tonsillectomy    . Left shoulder injection  10/07/13   Social History:  reports that he has never smoked. He does not have any  smokeless tobacco history on file. He reports that he drinks alcohol. He reports that he does not use illicit drugs. Lives with wife.  Moved from [place] 1 year ago due to financial strain and living in house that is in a trust for his brother who is in prison and being released later this year.  His mother lives in a house next door which is in a trust for him.  They are financially insecure.  His wife is teaching at [employer].  They lost a family friend a few weeks ago.  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    No Known Allergies  Family History  Problem Relation Age of Onset  . Stroke Neg Hx   . Heart disease Father   . Depression Mother     with previous suicide attempt  . Bipolar disorder Brother   . Alcoholism Paternal Uncle   . Cancer Neg Hx      Prior to Admission medications   Medication Sig Start Date End Date Taking? Authorizing Provider  HYDROcodone-acetaminophen (NORCO) 10-325 MG per tablet Take 1 tablet by mouth every 6 (six) hours as needed for moderate pain.   Yes Historical Provider, MD  losartan-hydrochlorothiazide (HYZAAR) 100-12.5 MG per tablet Take 1 tablet by mouth every morning.   Yes Historical Provider, MD  naproxen sodium (ANAPROX) 220 MG tablet Take 220 mg by mouth 2 (two) times daily as needed (pain).   Yes Historical Provider, MD  omeprazole (PRILOSEC) 20 MG capsule Take 20 mg by mouth every morning.   Yes Historical Provider, MD  indomethacin (INDOCIN) 50 MG capsule Take 50 mg by mouth 2 (two) times daily with a meal.    Historical Provider, MD   Physical Exam: Filed Vitals:   10/08/13 1433 10/08/13 1550 10/08/13 1742 10/08/13 1800  BP: 161/91  155/81 140/84  Pulse: 77  67 76  Temp: 98.9 F (37.2 C) 98.6 F (37 C)    TempSrc: Oral     Resp: 16  20 18   SpO2: 93%  97% 99%     General:  CM, NAD  Eyes:  PERRL, anicteric, non-injected.  ENT:  Nares clear.  OP clear, non-erythematous without plaques or exudates.   MMM.  Neck:  Supple without TM or JVD.    Lymph:  No cervical, supraclavicular, or submandibular LAD.  Cardiovascular:  RRR, normal S1, S2, without m/r/g.  2+ pulses, warm extremities  Respiratory:  CTA bilaterally without increased WOB.  Abdomen:  NABS.  Soft, ND/NT.    Skin:  No rashes or focal lesions.  Musculoskeletal:  Normal bulk and tone.  No LE edema.  Psychiatric:  A & O x 4.  Appropriate affect.  Neurologic:  CN 3-12 intact.  5/5 strength.  Sensation intact.  No dysmetria.  Almost aggressive when attempting finger to nose.  Not able to recall year, month,  but [facility] towards end of exam.   Labs on Admission:  Basic Metabolic Panel:  Recent Labs Lab 10/08/13 1449 10/08/13 1510  NA 134* 135*  K 4.0 3.8  CL 95* 100  CO2 23  --   GLUCOSE 119* 117*  BUN 20 20  CREATININE 0.92 1.00  CALCIUM 9.9  --    Liver Function Tests:  Recent Labs Lab 10/08/13 1449  AST 35  ALT 51  ALKPHOS 79  BILITOT 0.8  PROT 7.8  ALBUMIN 4.5   No results found for this basename: LIPASE, AMYLASE,  in the last 168 hours No results found for this basename: AMMONIA,  in the last 168 hours CBC:  Recent Labs Lab 10/08/13 1449 10/08/13 1510  WBC 11.8*  --   NEUTROABS 9.5*  --   HGB 15.3 16.7  HCT 43.4 49.0  MCV 95.4  --   PLT 333  --    Cardiac Enzymes: No results found for this basename: CKTOTAL, CKMB, CKMBINDEX, TROPONINI,  in the last 168 hours  BNP (last 3 results) No results found for this basename: PROBNP,  in the last 8760 hours CBG:  Recent Labs Lab 10/08/13 1430  GLUCAP 116*    Radiological Exams on Admission: Ct Head Wo Contrast  10/08/2013   CLINICAL DATA:  Memory loss, dizziness  EXAM: CT HEAD WITHOUT CONTRAST  TECHNIQUE: Contiguous axial images were obtained from the base of the skull through the vertex without intravenous contrast.  COMPARISON:  None  FINDINGS: There is no evidence of mass effect, midline shift, or extra-axial fluid  collections. There is no evidence of a space-occupying lesion or intracranial hemorrhage. There is no evidence of a cortical-based area of acute infarction. There is generalized cerebral atrophy. There is periventricular white matter low attenuation likely secondary to microangiopathy.  The ventricles and sulci are appropriate for the patient's age. The basal cisterns are patent.  Visualized portions of the orbits are unremarkable. The visualized portions of the paranasal sinuses and mastoid air cells are unremarkable.  The osseous structures are unremarkable.  IMPRESSION: No acute intracranial pathology.   Electronically Signed   By: Elige KoHetal  Patel   On: 10/08/2013 15:31    EKG: Independently reviewed. NSR  Assessment/Plan Active Problems:   Global amnesia   Stroke   Leukocytosis   Hyponatremia   Hypertension  ---  Amnesia, may be related to his stroke or seizure versus unusual side effect of narcotic pain medication.  Also consider psychotic break related to recent stress in setting of FHx of some mental health problems.   -  Neurology consultation -  MRI/MRA brain -  Echo -  Carotid duplex -  Telemetry -  Cycle troponins -  EEG -  Lipid panel and hemoglobin A1c -  Speech therapy consultation -  Stop narcotic pain medication  Arthritis, stable, continue NSAIDs  GERD, stable, continue PPI  Hypertension, blood pressure elevated possibly due to stress -  Continue losartan-HCTZ  Mild hyponatremia likely due to dehydration -  IVF and repeat in AM  Mild leukocytosis, likely dehydration related -  IVF and repeat in AM -  F/u CXR  Diet:  Healthy heart Access:  PIV IVF:  Yes Proph:  Lovenox  Code Status: Full Family Communication: Patient and his wife Disposition Plan[facility]  Time spent: 60 min Renae FickleSHORT, Emelia Sandoval [facility] Pager [phone]  If 7PM-7AM, please contact night-coverage [email or web address] Password [email or web address] 10/08/2013, 6:27  PM

## 2013-10-09 ENCOUNTER — Observation Stay (HOSPITAL_COMMUNITY): Payer: Medicare Other

## 2013-10-09 DIAGNOSIS — I517 Cardiomegaly: Secondary | ICD-10-CM

## 2013-10-09 DIAGNOSIS — I10 Essential (primary) hypertension: Secondary | ICD-10-CM

## 2013-10-09 DIAGNOSIS — E785 Hyperlipidemia, unspecified: Secondary | ICD-10-CM

## 2013-10-09 DIAGNOSIS — D72829 Elevated white blood cell count, unspecified: Secondary | ICD-10-CM

## 2013-10-09 DIAGNOSIS — E871 Hypo-osmolality and hyponatremia: Secondary | ICD-10-CM

## 2013-10-09 LAB — BASIC METABOLIC PANEL
Anion gap: 12 (ref 5–15)
BUN: 16 mg/dL (ref 6–23)
CHLORIDE: 98 meq/L (ref 96–112)
CO2: 27 mEq/L (ref 19–32)
Calcium: 8.9 mg/dL (ref 8.4–10.5)
Creatinine, Ser: 0.89 mg/dL (ref 0.50–1.35)
GFR calc Af Amer: >90 mL/min (ref >90)
GFR calc non Af Amer: 87 mL/min — ABNORMAL LOW (ref >90)
GLUCOSE: 112 mg/dL — AB (ref 70–99)
Potassium: 4.9 mEq/L (ref 3.7–5.3)
Sodium: 137 mEq/L (ref 137–147)

## 2013-10-09 LAB — HEMOGLOBIN A1C
Hgb A1c MFr Bld: 5.7 % — ABNORMAL HIGH (ref <5.7)
Mean Plasma Glucose: 117 mg/dL — ABNORMAL HIGH (ref <117)

## 2013-10-09 LAB — LIPID PANEL
CHOL/HDL RATIO: 3.3 ratio
CHOLESTEROL: 251 mg/dL — AB (ref 0–200)
HDL: 77 mg/dL (ref >39)
LDL Cholesterol: 127 mg/dL — ABNORMAL HIGH (ref 0–99)
Triglycerides: 233 mg/dL — ABNORMAL HIGH (ref <150)
VLDL: 47 mg/dL — AB (ref 0–40)

## 2013-10-09 LAB — CBC
HEMATOCRIT: 39.5 % (ref 39.0–52.0)
HEMOGLOBIN: 13.6 g/dL (ref 13.0–17.0)
MCH: 32.8 pg (ref 26.0–34.0)
MCHC: 34.4 g/dL (ref 30.0–36.0)
MCV: 95.2 fL (ref 78.0–100.0)
Platelets: 298 10*3/uL (ref 150–400)
RBC: 4.15 MIL/uL — AB (ref 4.22–5.81)
RDW: 12 % (ref 11.5–15.5)
WBC: 10.3 10*3/uL (ref 4.0–10.5)

## 2013-10-09 LAB — TROPONIN I
Troponin I: <0.3 ng/mL (ref <0.30)
Troponin I: <0.3 ng/mL (ref <0.30)

## 2013-10-09 MED ORDER — SIMVASTATIN 5 MG PO TABS
5.0000 mg | ORAL_TABLET | Freq: Every day | ORAL | Status: DC
  Filled 2013-10-09: qty 1

## 2013-10-09 MED ORDER — ASPIRIN EC 81 MG PO TBEC: 81.0000 mg | DELAYED_RELEASE_TABLET | Freq: Every day | ORAL | Status: DC

## 2013-10-09 MED ORDER — SIMVASTATIN 5 MG PO TABS: 5.0000 mg | ORAL_TABLET | Freq: Every day | ORAL | Status: DC

## 2013-10-09 MED ORDER — TRAMADOL HCL 50 MG PO TABS: 50.0000 mg | ORAL_TABLET | Freq: Three times a day (TID) | ORAL | Status: DC | PRN

## 2013-10-09 NOTE — Progress Notes (Signed)
PT Cancellation Note  Patient Details Name: Adam Mckenzie MRN: [id number] DOB: 10/22/1945   Cancelled Treatment:    Reason Eval/Treat Not Completed: Patient at procedure or test/unavailable (pt at test and now with SLP. will attempt later as time allows)   Toney Sangabor, Camyah Pultz [facility] 10/09/2013, 10:25 AM [name], PT [phone]

## 2013-10-09 NOTE — Procedures (Signed)
ELECTROENCEPHALOGRAM REPORT   Patient: Adam Mckenzie       Room #: [id number] EEG No. ID: [id number] Age: 68 y.o.        Sex: male Referring Physician: Rai Report Date:  10/09/2013        Interpreting Physician: Thana FarrEYNOLDS,Lynnley Doddridge D  History: Adam Mckenzie is an 68 y.o. male with episode of amnesia  Medications:  Hyzaar, Naprosyn, Prilosec, Norco, Indocin  Conditions of Recording:  This is a 16 channel EEG carried out with the patient in the awake, drowsy and asleep states.  Description:  The waking background activity consists of a low voltage, symmetrical, fairly well organized, 11 Hz alpha activity, seen from the parieto-occipital and posterior temporal regions.  Low voltage fast activity, poorly organized, is seen anteriorly and is at times superimposed on more posterior regions.  A mixture of theta and alpha rhythms are seen from the central and temporal regions. The patient drowses with slowing to irregular, low voltage theta and beta activity.   The patient goes in to a light sleep with symmetrical sleep spindles, vertex central sharp transients and irregular slow activity.  Hyperventilation and intermittent photic stimulation were not performed.   IMPRESSION: This is a normal electroencephalogram.  No epileptiform activity is noted.  Thana FarrLeslie Edessa Jakubowicz, MD [facility] [phone] 10/09/2013, 6:17 PM

## 2013-10-09 NOTE — Progress Notes (Signed)
OT Cancellation Note  Patient Details Name: Adam Mckenzie MRN: [id number] DOB: Jun 18, 1945   Cancelled Treatment:    Reason Eval/Treat Not Completed: PT screened, no needs identified, will sign off  Dray Dente 10/09/2013, 2:40 PM Marica OtterMaryellen Scarlett Portlock, OTR/L [phone] 10/09/2013

## 2013-10-09 NOTE — Progress Notes (Signed)
  Echocardiogram 2D Echocardiogram has been performed.  Leta JunglingCooper, Preethi Scantlebury M 10/09/2013, 10:17 AM

## 2013-10-09 NOTE — Discharge Summary (Signed)
Physician Discharge Summary  Patient ID: Adam Mckenzie MRN: [id number] DOB/AGE: May 07, 1945 68 y.o.  Admit date: 10/08/2013 Discharge date: 10/09/2013  Primary Care Physician:  Pearla DubonnetGATES,ROBERT NEVILL, MD  Discharge Diagnoses:    . transient Global amnesia possible TIA versus narcotic effect  . Leukocytosis resolved, likely stress demargination  . Hyponatremia resolved  . Hypertension . Other and unspecified hyperlipidemia  Consults:  Neurology, Dr. Cyril Mourningamillo  Recommendations for Outpatient Follow-up:  Please follow lipid panel in 3 months  Allergies:  No Known Allergies   Discharge Medications:   Medication List    STOP taking these medications       HYDROcodone-acetaminophen 10-325 MG per tablet  Commonly known as:  NORCO      TAKE these medications       aspirin EC 81 MG tablet  Take 1 tablet (81 mg total) by mouth daily.     indomethacin 50 MG capsule  Commonly known as:  INDOCIN  Take 50 mg by mouth 2 (two) times daily with a meal.     losartan-hydrochlorothiazide 100-12.5 MG per tablet  Commonly known as:  HYZAAR  Take 1 tablet by mouth every morning.     naproxen sodium 220 MG tablet  Commonly known as:  ANAPROX  Take 220 mg by mouth 2 (two) times daily as needed (pain).     omeprazole 20 MG capsule  Commonly known as:  PRILOSEC  Take 20 mg by mouth every morning.     simvastatin 5 MG tablet  Commonly known as:  ZOCOR  Take 1 tablet (5 mg total) by mouth at bedtime.     traMADol 50 MG tablet  Commonly known as:  ULTRAM  Take 1 tablet (50 mg total) by mouth every 8 (eight) hours as needed for severe pain.         Brief H and P: For complete details please refer to admission H and P, but in brief The patient is a 68 y.o. year-old male with history of HTN and GERD who presents with memory loss. The patient was last at their baseline health earlier today. 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 He had a normal conversation on the phone, however when he hung up and she asked him what they discussed, and she had a long pause and then finally answered that he did not remember. He was flushed and diaphoretic. He did not have any facial droop, slurred speech, weakness of an arm or leg. His wife realized that he did not know the month, year, dogs name, or the fact that he had a steroid injection of his left shoulder yesterday. She brought him urgently to the emergency department because of concern for stroke. Initial assessment in the emergency department was consistent with her report. He did not remember any events over the last several weeks. Initially, he repeatedly asked his wife why he had IVs placed in his arm, what his armband was, where he was, etc. According to his wife, he has had extraordinary stress over the last year with the loss of his job, difficulty with finances, living next to his mother who has some mental health issues. He has not had any problems previously with drug use however for the last 2 days he has been taking hydrocodone. He drinks approximately 3 alcoholic beverages per day and has never had withdrawal from alcohol. He had  2 sips of line with lunch prior to making his phone call to [place] and did not have any signs of alcohol intoxication at the time that this happened. During my questioning, the patient was unaware where he was, at the month, the year. He again did not remember about his shoulder injection yesterday. He was able to recall the names of his sons and their approximate ages. He stated that his thinking felt fuzzy, but he denied any numbness, tingling, weakness. At the end of my exam, he was able to recall where he was and what his IVs were for.  In the emergency department, vital signs were notable for mild elevation of blood pressure 161/91. WBC 11.8, sodium 134, chloride 95, liver function tests within normal  limits. Troponin negative. EKG normal sinus rhythm. Urine drug screen positive for opiates. CT head unremarkable. Case was discussed with neurology who asked that he be admitted over to [facility] for further evaluation.   Hospital Course:    Transient Global amnesia likely due to narcotic effect versus TIA; patient was admitted for further workup at [facility] . Neurology was consulted and patient was seen by Dr. Cyril Mourning. Differential diagnosis included TIA, partial complex seizure versus narcotic effect from the recently taken hydrocodones. Patient underwent a stroke workup, MRI brain showed no acute infarct. MRA was unremarkable. 2-D echo showed EF of 50-55%, normal wall motion, no valvular stenosis/regurgitation. Carotid Dopplers showed 1-39% ICA stenosis EEG per verbal report from Dr. Thad Ranger is also unremarkable and did not show any epileptiform activity. Urine tox screen was positive for opiates, alcohol level 13.  Patient was placed on aspirin 81 mg daily as TIA is still in differential diagnosis. Lipid panel showed cholesterol 21, triglycerides 233, LDL 127. Patient was recommended heart healthy diet, started on Zocor 5 mg daily. Patient is ambulating without any difficulty and is currently at baseline.    Leukocytosis: Likely stress demargination, resolved    Hyponatremia Resolved:  patient received gentle hydration    Hypertension: Stable     Other and unspecified hyperlipidemia Lipid panel showed cholesterol 21, triglycerides 233, LDL 127. Patient was recommended heart healthy diet, started on Zocor 5 mg daily.  Day of Discharge BP 158/85  Pulse 59  Temp(Src) 99.3 F (37.4 C) (Oral)  Resp 20  Ht 5\' 9"  (1.753 m)  Wt 76.1 kg (167 lb 12.3 oz)  BMI 24.76 kg/m2  SpO2 100%  Physical Exam: General: Alert and awake oriented x3 not in any acute distress. HEENT: anicteric sclera, pupils reactive to light and accommodation CVS: S1-S2 clear no murmur rubs or gallops Chest:  clear to auscultation bilaterally, no wheezing rales or rhonchi Abdomen: soft nontender, nondistended, normal bowel sounds Extremities: no cyanosis, clubbing or edema noted bilaterally Neuro: Cranial nerves II-XII intact, no focal neurological deficits   The results of significant diagnostics from this hospitalization (including imaging, microbiology, ancillary and laboratory) are listed below for reference.    LAB RESULTS: Basic Metabolic Panel:  Recent Labs Lab 10/08/13 1449 10/08/13 1510 10/09/13 0254  NA 134* 135* 137  K 4.0 3.8 4.9  CL 95* 100 98  CO2 23  --  27  GLUCOSE 119* 117* 112*  BUN 20 20 16   CREATININE 0.92 1.00 0.89  CALCIUM 9.9  --  8.9   Liver Function Tests:  Recent Labs Lab 10/08/13 1449  AST 35  ALT 51  ALKPHOS 79  BILITOT 0.8  PROT 7.8  ALBUMIN 4.5   No results found for this  basename: LIPASE, AMYLASE,  in the last 168 hours No results found for this basename: AMMONIA,  in the last 168 hours CBC:  Recent Labs Lab 10/08/13 1449 10/08/13 1510 10/09/13 0254  WBC 11.8*  --  10.3  NEUTROABS 9.5*  --   --   HGB 15.3 16.7 13.6  HCT 43.4 49.0 39.5  MCV 95.4  --  95.2  PLT 333  --  298   Cardiac Enzymes:  Recent Labs Lab 10/09/13 0254 10/09/13 1110  TROPONINI <0.30 <0.30   BNP: No components found with this basename: POCBNP,  CBG:  Recent Labs Lab 10/08/13 1430  GLUCAP 116*    Significant Diagnostic Studies:  Dg Chest 2 View  10/09/2013   CLINICAL DATA:  Stroke  EXAM: CHEST  2 VIEW  COMPARISON:  None.  FINDINGS: Cardiomediastinal silhouette is unremarkable. No acute infiltrate or pleural effusion. No pulmonary edema. Degenerative changes thoracic spine. Probable bilateral basilar nodular nipple shadow. Repeat frontal view with nipple markers is recommended for confirmation.  IMPRESSION: No acute infiltrate or pleural effusion. No pulmonary edema. Degenerative changes thoracic spine. Probable bilateral basilar nodular nipple shadow.  Repeat frontal view with nipple markers is recommended for confirmation.   Electronically Signed   By: Natasha Mead M.D.   On: 10/09/2013 09:57   Ct Head Wo Contrast  10/08/2013   CLINICAL DATA:  Memory loss, dizziness  EXAM: CT HEAD WITHOUT CONTRAST  TECHNIQUE: Contiguous axial images were obtained from the base of the skull through the vertex without intravenous contrast.  COMPARISON:  None  FINDINGS: There is no evidence of mass effect, midline shift, or extra-axial fluid collections. There is no evidence of a space-occupying lesion or intracranial hemorrhage. There is no evidence of a cortical-based area of acute infarction. There is generalized cerebral atrophy. There is periventricular white matter low attenuation likely secondary to microangiopathy.  The ventricles and sulci are appropriate for the patient's age. The basal cisterns are patent.  Visualized portions of the orbits are unremarkable. The visualized portions of the paranasal sinuses and mastoid air cells are unremarkable.  The osseous structures are unremarkable.  IMPRESSION: No acute intracranial pathology.   Electronically Signed   By: Elige Ko   On: 10/08/2013 15:31   Mr Brain Wo Contrast  10/09/2013   CLINICAL DATA:  Episode of memory loss.  Possible TIA.  EXAM: MRI HEAD WITHOUT CONTRAST  MRA HEAD WITHOUT CONTRAST  TECHNIQUE: Multiplanar, multiecho pulse sequences of the brain and surrounding structures were obtained without intravenous contrast. Angiographic images of the head were obtained using MRA technique without contrast.  COMPARISON:  CT head without contrast 10/08/2013  FINDINGS: MRI HEAD FINDINGS  The diffusion-weighted images demonstrate no evidence for acute or subacute infarction. No hemorrhage or mass lesion is present. Midline structures are normal. Mild atrophy and minimal periventricular white matter changes are noted bilaterally. The ventricles are of normal size. No significant extraaxial fluid collection is present.   Flow is present in the major intracranial arteries. The paranasal sinuses and mastoid air cells are clear. The patient is status post bilateral lens replacements.  MRA HEAD FINDINGS  The internal carotid arteries are within normal limits from the high cervical segments through the ICA termini bilaterally. The A1 and M1 segments are normal. The anterior communicating artery is present. The MCA bifurcations are intact. There is some attenuation of distal MCA branch vessels bilaterally. No significant proximal stenosis or occlusion is present.  The right vertebral artery is the dominant vessel. The  right PICA origin is visualized and normal. The left AICA is dominant. The basilar artery is within normal limits. Both posterior cerebral arteries originate from the basilar tip. There is some attenuation of distal PCA branch vessels.  IMPRESSION: 1. No acute intracranial abnormality. 2. Mild small vessel disease evident with T2 periventricular T2 changes and distal attenuation of branch vessels on the MRA. 3. No significant proximal stenosis, aneurysm, or branch vessel occlusion.   Electronically Signed   By: Gennette Pac M.D.   On: 10/09/2013 13:52   Mr Maxine Glenn Head/brain Wo Cm  10/09/2013   CLINICAL DATA:  Episode of memory loss.  Possible TIA.  EXAM: MRI HEAD WITHOUT CONTRAST  MRA HEAD WITHOUT CONTRAST  TECHNIQUE: Multiplanar, multiecho pulse sequences of the brain and surrounding structures were obtained without intravenous contrast. Angiographic images of the head were obtained using MRA technique without contrast.  COMPARISON:  CT head without contrast 10/08/2013  FINDINGS: MRI HEAD FINDINGS  The diffusion-weighted images demonstrate no evidence for acute or subacute infarction. No hemorrhage or mass lesion is present. Midline structures are normal. Mild atrophy and minimal periventricular white matter changes are noted bilaterally. The ventricles are of normal size. No significant extraaxial fluid collection is  present.  Flow is present in the major intracranial arteries. The paranasal sinuses and mastoid air cells are clear. The patient is status post bilateral lens replacements.  MRA HEAD FINDINGS  The internal carotid arteries are within normal limits from the high cervical segments through the ICA termini bilaterally. The A1 and M1 segments are normal. The anterior communicating artery is present. The MCA bifurcations are intact. There is some attenuation of distal MCA branch vessels bilaterally. No significant proximal stenosis or occlusion is present.  The right vertebral artery is the dominant vessel. The right PICA origin is visualized and normal. The left AICA is dominant. The basilar artery is within normal limits. Both posterior cerebral arteries originate from the basilar tip. There is some attenuation of distal PCA branch vessels.  IMPRESSION: 1. No acute intracranial abnormality. 2. Mild small vessel disease evident with T2 periventricular T2 changes and distal attenuation of branch vessels on the MRA. 3. No significant proximal stenosis, aneurysm, or branch vessel occlusion.   Electronically Signed   By: Gennette Pac M.D.   On: 10/09/2013 13:52    2D ECHO: Study Conclusions  - Left ventricle: The cavity size was normal. Wall thickness was normal. Systolic function was normal. The estimated ejection fraction was in the range of 50% to 55%. Wall motion was normal; there were no regional wall motion abnormalities. - Left atrium: The atrium was mildly dilated.    Disposition and Follow-up:     Discharge Instructions   Diet - low sodium heart healthy    Complete by:  As directed      Increase activity slowly    Complete by:  As directed             DISPOSITION: Home   DIET:Heart healthy diet     DISCHARGE FOLLOW-UP Follow-up Information   Follow up with GATES,ROBERT NEVILL, MD. Schedule an appointment as soon as possible for a visit in 2 weeks. (for hospital follow-up)     Specialty:  Internal Medicine   Contact information:   [address] [place] [address] [phone]       Time spent on Discharge: 40 minutes   Signed:   RAI,RIPUDEEP M.D. [facility] 10/09/2013, 2:21 PM Pager: [phone]   **Disclaimer:  This note was dictated with voice recognition software. Similar sounding words can inadvertently be transcribed and this note may contain transcription errors which may not have been corrected upon publication of note.**

## 2013-10-09 NOTE — Progress Notes (Signed)
Subjective: Patient back to baseline per patient and wife.  Is unable to remember details of event.    Objective: Current vital signs: BP 158/85  Pulse 59  Temp(Src) 99.3 F (37.4 C) (Oral)  Resp 20  Ht 5\' 9"  (1.753 m)  Wt 76.1 kg (167 lb 12.3 oz)  BMI 24.76 kg/m2  SpO2 100% Vital signs in last 24 hours: Temp:  [98 F (36.7 C)-99.5 F (37.5 C)] 99.3 F (37.4 C) (07/31 1149) Pulse Rate:  [49-97] 59 (07/31 1149) Resp:  [15-23] 20 (07/31 1149) BP: (124-171)/(56-85) 158/85 mmHg (07/31 1149) SpO2:  [96 %-100 %] 100 % (07/31 1149) Weight:  [76.1 kg (167 lb 12.3 oz)] 76.1 kg (167 lb 12.3 oz) (07/31 0500)  Intake/Output from previous day: 07/30 0701 - 07/31 0700 In: -  Out: 1575 [Urine:1575] Intake/Output this shift: Total I/O In: 120 [P.O.:120] Out: -  Nutritional status: Cardiac  Neurologic Exam: Mental Status:  Alert, oriented, thought content appropriate. Speech fluent without evidence of aphasia. Able to follow 3 step commands without difficulty.  Cranial Nerves:  II: Discs flat bilaterally; Visual fields grossly normal, pupils equal, round, reactive to light and accommodation  III,IV, VI: ptosis not present, extra-ocular motions intact bilaterally  V,VII: smile symmetric, facial light touch sensation normal bilaterally  VIII: hearing normal bilaterally  IX,X: gag reflex present  XI: bilateral shoulder shrug  XII: midline tongue extension without atrophy or fasciculations  Motor:  5/5 throughout; no atrophy noted  Sensory: Pinprick and light touch intact throughout, bilaterally  Deep Tendon Reflexes:  2+ throughout Plantars:  Right: downgoing    Left: downgoing  Cerebellar:  normal finger-to-nose, normal heel-to-shin test    Lab Results: Basic Metabolic Panel:  Recent Labs Lab 10/08/13 1449 10/08/13 1510 10/09/13 0254  NA 134* 135* 137  K 4.0 3.8 4.9  CL 95* 100 98  CO2 23  --  27  GLUCOSE 119* 117* 112*  BUN 20 20 16   CREATININE 0.92 1.00 0.89   CALCIUM 9.9  --  8.9    Liver Function Tests:  Recent Labs Lab 10/08/13 1449  AST 35  ALT 51  ALKPHOS 79  BILITOT 0.8  PROT 7.8  ALBUMIN 4.5   No results found for this basename: LIPASE, AMYLASE,  in the last 168 hours No results found for this basename: AMMONIA,  in the last 168 hours  CBC:  Recent Labs Lab 10/08/13 1449 10/08/13 1510 10/09/13 0254  WBC 11.8*  --  10.3  NEUTROABS 9.5*  --   --   HGB 15.3 16.7 13.6  HCT 43.4 49.0 39.5  MCV 95.4  --  95.2  PLT 333  --  298    Cardiac Enzymes:  Recent Labs Lab 10/08/13 2125 10/09/13 0254 10/09/13 1110  TROPONINI <0.30 <0.30 <0.30    Lipid Panel:  Recent Labs Lab 10/09/13 0254  CHOL 251*  TRIG 233*  HDL 77  CHOLHDL 3.3  VLDL 47*  LDLCALC 127*    CBG:  Recent Labs Lab 10/08/13 1430  GLUCAP 116*    Microbiology: No results found for this or any previous visit.  Coagulation Studies:  Recent Labs  10/08/13 1450  LABPROT 11.4*  INR 0.83    Imaging: Dg Chest 2 View  10/09/2013   CLINICAL DATA:  Stroke  EXAM: CHEST  2 VIEW  COMPARISON:  None.  FINDINGS: Cardiomediastinal silhouette is unremarkable. No acute infiltrate or pleural effusion. No pulmonary edema. Degenerative changes thoracic spine. Probable bilateral basilar nodular nipple shadow.  Repeat frontal view with nipple markers is recommended for confirmation.  IMPRESSION: No acute infiltrate or pleural effusion. No pulmonary edema. Degenerative changes thoracic spine. Probable bilateral basilar nodular nipple shadow. Repeat frontal view with nipple markers is recommended for confirmation.   Electronically Signed   By: Natasha Mead M.D.   On: 10/09/2013 09:57   Ct Head Wo Contrast  10/08/2013   CLINICAL DATA:  Memory loss, dizziness  EXAM: CT HEAD WITHOUT CONTRAST  TECHNIQUE: Contiguous axial images were obtained from the base of the skull through the vertex without intravenous contrast.  COMPARISON:  None  FINDINGS: There is no evidence of  mass effect, midline shift, or extra-axial fluid collections. There is no evidence of a space-occupying lesion or intracranial hemorrhage. There is no evidence of a cortical-based area of acute infarction. There is generalized cerebral atrophy. There is periventricular white matter low attenuation likely secondary to microangiopathy.  The ventricles and sulci are appropriate for the patient's age. The basal cisterns are patent.  Visualized portions of the orbits are unremarkable. The visualized portions of the paranasal sinuses and mastoid air cells are unremarkable.  The osseous structures are unremarkable.  IMPRESSION: No acute intracranial pathology.   Electronically Signed   By: Elige Ko   On: 10/08/2013 15:31   Mr Brain Wo Contrast  10/09/2013   CLINICAL DATA:  Episode of memory loss.  Possible TIA.  EXAM: MRI HEAD WITHOUT CONTRAST  MRA HEAD WITHOUT CONTRAST  TECHNIQUE: Multiplanar, multiecho pulse sequences of the brain and surrounding structures were obtained without intravenous contrast. Angiographic images of the head were obtained using MRA technique without contrast.  COMPARISON:  CT head without contrast 10/08/2013  FINDINGS: MRI HEAD FINDINGS  The diffusion-weighted images demonstrate no evidence for acute or subacute infarction. No hemorrhage or mass lesion is present. Midline structures are normal. Mild atrophy and minimal periventricular white matter changes are noted bilaterally. The ventricles are of normal size. No significant extraaxial fluid collection is present.  Flow is present in the major intracranial arteries. The paranasal sinuses and mastoid air cells are clear. The patient is status post bilateral lens replacements.  MRA HEAD FINDINGS  The internal carotid arteries are within normal limits from the high cervical segments through the ICA termini bilaterally. The A1 and M1 segments are normal. The anterior communicating artery is present. The MCA bifurcations are intact. There is  some attenuation of distal MCA branch vessels bilaterally. No significant proximal stenosis or occlusion is present.  The right vertebral artery is the dominant vessel. The right PICA origin is visualized and normal. The left AICA is dominant. The basilar artery is within normal limits. Both posterior cerebral arteries originate from the basilar tip. There is some attenuation of distal PCA branch vessels.  IMPRESSION: 1. No acute intracranial abnormality. 2. Mild small vessel disease evident with T2 periventricular T2 changes and distal attenuation of branch vessels on the MRA. 3. No significant proximal stenosis, aneurysm, or branch vessel occlusion.   Electronically Signed   By: Gennette Pac M.D.   On: 10/09/2013 13:52   Mr Maxine Glenn Head/brain Wo Cm  10/09/2013   CLINICAL DATA:  Episode of memory loss.  Possible TIA.  EXAM: MRI HEAD WITHOUT CONTRAST  MRA HEAD WITHOUT CONTRAST  TECHNIQUE: Multiplanar, multiecho pulse sequences of the brain and surrounding structures were obtained without intravenous contrast. Angiographic images of the head were obtained using MRA technique without contrast.  COMPARISON:  CT head without contrast 10/08/2013  FINDINGS: MRI HEAD FINDINGS  The diffusion-weighted images demonstrate no evidence for acute or subacute infarction. No hemorrhage or mass lesion is present. Midline structures are normal. Mild atrophy and minimal periventricular white matter changes are noted bilaterally. The ventricles are of normal size. No significant extraaxial fluid collection is present.  Flow is present in the major intracranial arteries. The paranasal sinuses and mastoid air cells are clear. The patient is status post bilateral lens replacements.  MRA HEAD FINDINGS  The internal carotid arteries are within normal limits from the high cervical segments through the ICA termini bilaterally. The A1 and M1 segments are normal. The anterior communicating artery is present. The MCA bifurcations are intact.  There is some attenuation of distal MCA branch vessels bilaterally. No significant proximal stenosis or occlusion is present.  The right vertebral artery is the dominant vessel. The right PICA origin is visualized and normal. The left AICA is dominant. The basilar artery is within normal limits. Both posterior cerebral arteries originate from the basilar tip. There is some attenuation of distal PCA branch vessels.  IMPRESSION: 1. No acute intracranial abnormality. 2. Mild small vessel disease evident with T2 periventricular T2 changes and distal attenuation of branch vessels on the MRA. 3. No significant proximal stenosis, aneurysm, or branch vessel occlusion.   Electronically Signed   By: Gennette Pachris  Mattern M.D.   On: 10/09/2013 13:52    Medications:  I have reviewed the patient's current medications. Scheduled: . aspirin  300 mg Rectal Daily   Or  . aspirin  325 mg Oral Daily  . enoxaparin (LOVENOX) injection  40 mg Subcutaneous Q24H  . losartan  100 mg Oral Daily   And  . hydrochlorothiazide  12.5 mg Oral Daily  . pantoprazole  40 mg Oral Daily  . simvastatin  5 mg Oral q1800    Assessment/Plan: Patient at baseline.  MRI of the brain reviewed and shows no evidence of acute changes.  EEG unremarkable.  Episode likely TGA.  Carotid dopplers unremarkable.    1. No further neurologic intervention is recommended at this time.  If further questions arise, please call or page at that time.  Thank you for allowing neurology to participate in the care of this patient.    LOS: 1 day   Thana FarrLeslie Rawn Quiroa, MD [facility] [phone] 10/09/2013  3:20 PM

## 2013-10-09 NOTE — Progress Notes (Signed)
PT Cancellation Note  Patient Details Name: Adam Mckenzie MRN: [id number] DOB: 22-Aug-1945   Cancelled Treatment:    Reason Eval/Treat Not Completed: PT screened, no needs identified, will sign off. Pt, spouse and RN all report pt completely independent without change in balance, sensation, strength or coordination and state no need for therapy at this time. Will sign off due to pt request secondary to no need.   Toney Sangabor, Trung Wenzl Beth 10/09/2013, 10:48 AM Delaney MeigsMaija Tabor Tasheena Wambolt, PT [phone]

## 2013-10-09 NOTE — Progress Notes (Signed)
EEG Completed; Results Pending  

## 2013-10-09 NOTE — Progress Notes (Signed)
Pt discharged to home per MD order. Pt received and reviewed all discharge instructions and medication information including follow-up appointments and prescription information. Pt verbalized understanding. Pt alert and oriented at discharge with no complaints of pain. Pt IV and telemetry box removed prior to discharge. Pt ambulated to private vehicle per pt request. Marysol Wellnitz C  

## 2013-10-09 NOTE — Evaluation (Signed)
Speech Language Pathology Evaluation Patient Details Name: Adam Mckenzie MRN: [id number] DOB: May 27, 1945 Today's Date: 10/09/2013 Time:  -     Problem List:  Patient Active Problem List   Diagnosis Date Noted  . 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 Stroke 10/08/2013  . Leukocytosis 10/08/2013  . Hyponatremia 10/08/2013  . Hypertension 10/08/2013   Past Medical History:  Past Medical History  Diagnosis Date  . Hypertension   . GERD (gastroesophageal reflux disease)   . Retinal detachment   . Tendonitis     left shoulder   Past Surgical History:  Past Surgical History  Procedure Laterality Date  . Eye surgery  09/09/2013    retinal detachment surgery  . Tonsillectomy    . Left shoulder injection  10/07/13   HPI:  Adam Mckenzie is an 68 y.o. male with a past medical history significant for HTN, retinal detachment, GERD, transferred to [facility] after sustaining a transient episode of memory dysfunction. MRI pending.    Assessment / Plan / Recommendation Clinical Impression  Pt demonstrates independence with cognitive lingusitic tasks, no evidence of impairment. Pt able to store new information, recalls recent hospital events well. No SLP f/u needed. Pt and wife in agreement. SLP will sign off.     SLP Assessment  Patient does not need any further Speech Lanaguage Pathology Services    Follow Up Recommendations       Frequency and Duration        Pertinent Vitals/Pain NA   SLP Goals     SLP Evaluation Prior Functioning  Cognitive/Linguistic Baseline: Within functional limits   Cognition  Overall Cognitive Status: Within Functional Limits for tasks assessed Orientation Level: Oriented X4    Comprehension  Auditory Comprehension Overall Auditory Comprehension: Appears within functional limits for tasks assessed    Expression Verbal Expression Overall Verbal Expression: Appears within functional limits for tasks assessed   Oral / Motor Oral Motor/Sensory Function Overall  Oral Motor/Sensory Function: Appears within functional limits for tasks assessed Motor Speech Overall Motor Speech: Appears within functional limits for tasks assessed   GO Functional Assessment Tool Used: clinical judgement Functional Limitations: Memory Memory Current Status (W0981(G9168): 0 percent impaired, limited or restricted Memory Goal Status (X9147(G9169): 0 percent impaired, limited or restricted Memory Discharge Status (W2956(G9170): 0 percent impaired, limited or restricted   Tiena Manansala, Riley NearingBonnie Caroline 10/09/2013, 10:47 AM

## 2013-10-09 NOTE — Discharge Instructions (Signed)
STROKE/TIA DISCHARGE INSTRUCTIONS SMOKING Cigarette smoking nearly doubles your risk of having a stroke & is the single most alterable risk factor  If you smoke or have smoked in the last 12 months, you are advised to quit smoking for your health.  Most of the excess cardiovascular risk related to smoking disappears within a year of stopping.  Ask you doctor about anti-smoking medications  [place] Quit Line: 1-800-QUIT NOW  Free Smoking Cessation Classes [phone]  CHOLESTEROL Know your levels; limit fat & cholesterol in your diet  Lipid Panel     Component Value Date/Time   CHOL 251* 10/09/2013 0254   TRIG 233* 10/09/2013 0254   HDL 77 10/09/2013 0254   CHOLHDL 3.3 10/09/2013 0254   VLDL 47* 10/09/2013 0254   LDLCALC 127* 10/09/2013 0254      Many patients benefit from treatment even if their cholesterol is at goal.  Goal: Total Cholesterol (CHOL) less than 160  Goal:  Triglycerides (TRIG) less than 150  Goal:  HDL greater than 40  Goal:  LDL (LDLCALC) less than 100   BLOOD PRESSURE American Stroke Association blood pressure target is less that 120/80 mm/Hg  Your discharge blood pressure is:  BP: 158/85 mmHg  Monitor your blood pressure  Limit your salt and alcohol intake  Many individuals will require more than one medication for high blood pressure  DIABETES (A1c is a blood sugar average for last 3 months) Goal HGBA1c is under 7% (HBGA1c is blood sugar average for last 3 months)  Diabetes: No known diagnosis of diabetes    No results found for this basename: HGBA1C     Your HGBA1c can be lowered with medications, healthy diet, and exercise.  Check your blood sugar as directed by your physician  Call your physician if you experience unexplained or low blood sugars.  PHYSICAL ACTIVITY/REHABILITATION Goal is 30 minutes at least 4 days per week  Activity: No restrictions. Therapies: None Return to work:   Activity decreases your risk of heart attack and stroke and  makes your heart stronger.  It helps control your weight and blood pressure; helps you relax and can improve your mood.  Participate in a regular exercise program.  Talk with your doctor about the best form of exercise for you (dancing, walking, swimming, cycling).  DIET/WEIGHT Goal is to maintain a healthy weight  Your discharge diet is: Cardiac Your height is:  Height: 5\' 9"  (175.3 cm) Your current weight is: Weight: 76.1 kg (167 lb 12.3 oz) Your Body Mass Index (BMI) is:  BMI (Calculated): 24.8  Following the type of diet specifically designed for you will help prevent another stroke.  Your goal weight range is:  128-162lbs  Your goal Body Mass Index (BMI) is 19-24.  Healthy food habits can help reduce 3 risk factors for stroke:  High cholesterol, hypertension, and excess weight.  RESOURCES Stroke/Support Group:  Call [phone]   STROKE EDUCATION PROVIDED/REVIEWED AND GIVEN TO PATIENT Stroke warning signs and symptoms How to activate emergency medical system (call 911). Medications prescribed at discharge. Need for follow-up after discharge. Personal risk factors for stroke. Pneumonia vaccine given: No Flu vaccine given: No My questions have been answered, the writing is legible, and I understand these instructions.  I will adhere to these goals & educational materials that have been provided to me after my discharge from the hospital.

## 2013-10-09 NOTE — Progress Notes (Signed)
VASCULAR LAB PRELIMINARY  PRELIMINARY  PRELIMINARY  PRELIMINARY  Carotid duplex  completed.    Preliminary report:  Bilateral:  1-39% ICA stenosis.  Vertebral artery flow is antegrade.      Jerrion Tabbert, RVT 10/09/2013, 10:57 AM

## 2013-10-09 NOTE — Progress Notes (Signed)
Utilization Review Completed.Leith Szafranski T7/31/2015  

## 2013-11-21 ENCOUNTER — Encounter (HOSPITAL_COMMUNITY): Payer: Self-pay | Admitting: Family Medicine

## 2013-11-21 ENCOUNTER — Emergency Department (HOSPITAL_COMMUNITY)
Admission: EM | Admit: 2013-11-21 | Discharge: 2013-11-21 | Disposition: A | Discharge status: Home / Self Care | Payer: Medicare Other | Source: Home / Self Care | Attending: Family Medicine | Admitting: Family Medicine

## 2013-11-21 DIAGNOSIS — H9190 Unspecified hearing loss, unspecified ear: Secondary | ICD-10-CM

## 2013-11-21 DIAGNOSIS — H9193 Unspecified hearing loss, bilateral: Secondary | ICD-10-CM

## 2013-11-21 DIAGNOSIS — H6993 Unspecified Eustachian tube disorder, bilateral: Secondary | ICD-10-CM

## 2013-11-21 DIAGNOSIS — H6990 Unspecified Eustachian tube disorder, unspecified ear: Secondary | ICD-10-CM

## 2013-11-21 MED ORDER — PREDNISONE 50 MG PO TABS: ORAL_TABLET | ORAL | Status: DC

## 2013-11-21 MED ORDER — KETOROLAC TROMETHAMINE 60 MG/2ML IM SOLN
INTRAMUSCULAR | Status: AC
  Filled 2013-11-21: qty 2

## 2013-11-21 MED ORDER — KETOROLAC TROMETHAMINE 60 MG/2ML IM SOLN
60.0000 mg | Freq: Once | INTRAMUSCULAR | Status: AC
  Administered 2013-11-21: 60 mg via INTRAMUSCULAR

## 2013-11-21 NOTE — ED Provider Notes (Signed)
CSN: [id number]     Arrival date & time 11/21/13  1239 History   First MD Initiated Contact with Patient 11/21/13 1309     Chief Complaint  Patient presents with  . Ear Fullness   (Consider location/radiation/quality/duration/timing/severity/associated sxs/prior Treatment) HPI  Gradual loss of hearing in R ear which was made sigtnificantly worse last night when using a Qtip to try and clean out ear. Increased production in the past requiring physician disimpaction. OTC earwax cleaning solution w/o benefit.  Dull sensaion in R ear.  Deneis HA, loc, SZR, lightheadedness, syncope.    Past Medical History  Diagnosis Date  . Hypertension   . GERD (gastroesophageal reflux disease)   . Retinal detachment   . Tendonitis     left shoulder   Past Surgical History  Procedure Laterality Date  . Eye surgery  09/09/2013    retinal detachment surgery  . Tonsillectomy    . Left shoulder injection  10/07/13   Family History  Problem Relation Age of Onset  . Stroke Neg Hx   . Heart disease Father   . Depression Mother     with previous suicide attempt  . Bipolar disorder Brother   . Alcoholism Paternal Uncle   . Cancer Neg Hx    History  Substance Use Topics  . Smoking status: Never Smoker   . Smokeless tobacco: Not on file  . Alcohol Use: Yes     Comment: 2-3 beers per day and glass of wine    Review of Systems Per HPI with all other pertinent systems negative.   Allergies  Review of patient's allergies indicates no known allergies.  Home Medications   Prior to Admission medications   Medication Sig Start Date End Date Taking? Authorizing Provider  aspirin EC 81 MG tablet Take 1 tablet (81 mg total) by mouth daily. 10/09/13   Ripudeep Jenna Luo, MD  indomethacin (INDOCIN) 50 MG capsule Take 50 mg by mouth 2 (two) times daily with a meal.    Historical Provider, MD  losartan-hydrochlorothiazide (HYZAAR) 100-12.5 MG per tablet Take 1 tablet by mouth every morning.    Historical  Provider, MD  naproxen sodium (ANAPROX) 220 MG tablet Take 220 mg by mouth 2 (two) times daily as needed (pain).    Historical Provider, MD  omeprazole (PRILOSEC) 20 MG capsule Take 20 mg by mouth every morning.    Historical Provider, MD  predniSONE (DELTASONE) 50 MG tablet Take daily with breakfast 11/21/13   Ozella Rocks, MD  simvastatin (ZOCOR) 5 MG tablet Take 1 tablet (5 mg total) by mouth at bedtime. 10/09/13   Ripudeep Jenna Luo, MD  traMADol (ULTRAM) 50 MG tablet Take 1 tablet (50 mg total) by mouth every 8 (eight) hours as needed for severe pain. 10/09/13   Ripudeep K Rai, MD   BP 149/63  Pulse 59  Temp(Src) 98.2 F (36.8 C) (Oral)  Resp 16  SpO2 98% Physical Exam  Constitutional: He is oriented to person, place, and time. He appears well-developed and well-nourished. No distress.  HENT:  Head: Normocephalic and atraumatic.  Right Ear: External ear normal.  Left Ear: External ear normal.  TM bilat w/ mild clear effusion. No bulging or retraction  Eyes: EOM are normal. Pupils are equal, round, and reactive to light.  Neck: Normal range of motion. Neck supple.  Cardiovascular: Normal rate, normal heart sounds and intact distal pulses.   No murmur heard. Pulmonary/Chest: Effort normal and breath sounds normal. No respiratory distress. He has  no wheezes. He has no rales. He exhibits no tenderness.  Musculoskeletal: Normal range of motion. He exhibits no edema and no tenderness.  Neurological: He is alert and oriented to person, place, and time. No cranial nerve deficit. Coordination normal.  Skin: Skin is warm. He is not diaphoretic.  Psychiatric: He has a normal mood and affect. His behavior is normal. Thought content normal.  Hearing on manual exam diminished bilat (R>L) w/ scratch test    ED Course  Procedures (including critical care time) Labs Review Labs Reviewed - No data to display  Imaging Review No results found.   MDM   1. Eustachian tube disorder, bilateral    2. Hearing loss, bilateral    Most likely experiencing gradual hearing loss but may be exacerbated by clear middle ear effusion secondary to eustachian tube dysfucntion _toradol  IM in office - stat Prednisone  daily x 5 days - increase home prilosec to  daily for the next 1-2 days - f/u w/ ENT if not improving for full audiometry testing  - no signs of neuroma or other concerning acute process.   Precautions given and all questions answered   Shelly Flatten, MD Family Medicine 11/21/2013, 1:36 PM      Ozella Rocks, MD 11/21/13 [phone]

## 2013-11-21 NOTE — Discharge Instructions (Signed)
The cause of your hearing loss and fullness sansation is either from eustachian tube dysfunction adn clear fluid in ears or from gradual hearing loss.  Please start the prednisone. If you do not feel any improvement by Monday consider calling to set up an appointnment with an ENT physician for hearing testing.  Double your prilosec dose for a couple of days

## 2013-11-21 NOTE — ED Notes (Signed)
Pt  Reports  A   Sensation of  Fullness  In  Ears  r  Worse  Than left        SITTING  UPRIGHT ON  THE  EXAM TABLE  SPEAKING IN  COMPLETE  SENTANCES

## 2014-11-03 ENCOUNTER — Other Ambulatory Visit: Payer: Self-pay | Admitting: Gastroenterology

## 2015-03-28 ENCOUNTER — Other Ambulatory Visit: Payer: Self-pay | Admitting: Gastroenterology

## 2015-05-27 ENCOUNTER — Encounter (HOSPITAL_COMMUNITY): Payer: Self-pay | Admitting: *Deleted

## 2015-06-02 NOTE — Anesthesia Preprocedure Evaluation (Addendum)
Anesthesia Evaluation  Patient identified by MRN, date of birth, ID band Patient awake    Reviewed: Allergy & Precautions, NPO status , Patient's Chart, lab work & pertinent test results  Airway Mallampati: II   Neck ROM: Full    Dental  (+) Dental Advisory Given, Teeth Intact   Pulmonary neg pulmonary ROS, Current Smoker,    breath sounds clear to auscultation       Cardiovascular hypertension, Pt. on medications negative cardio ROS   Rhythm:Regular  ECHO 2015 EF 55%, valves normal   Neuro/Psych Carotids 2015 OK, CVA was global amnesia probably 2nd to morphine negative neurological ROS  negative psych ROS   GI/Hepatic negative GI ROS, Neg liver ROS, GERD  ,  Endo/Other  negative endocrine ROS  Renal/GU negative Renal ROS  negative genitourinary   Musculoskeletal negative musculoskeletal ROS (+)   Abdominal   Peds negative pediatric ROS (+)  Hematology negative hematology ROS (+)   Anesthesia Other Findings   Reproductive/Obstetrics negative OB ROS                            Anesthesia Physical Anesthesia Plan  ASA: II  Anesthesia Plan: MAC   Post-op Pain Management:    Induction: Intravenous  Airway Management Planned: Nasal Cannula  Additional Equipment:   Intra-op Plan:   Post-operative Plan:   Informed Consent: I have reviewed the patients History and Physical, chart, labs and discussed the procedure including the risks, benefits and alternatives for the proposed anesthesia with the patient or authorized representative who has indicated his/her understanding and acceptance.     Plan Discussed with:   Anesthesia Plan Comments:         Anesthesia Quick Evaluation

## 2015-06-06 ENCOUNTER — Encounter (HOSPITAL_COMMUNITY)
Admission: RE | Disposition: A | Discharge status: Home / Self Care | Payer: Self-pay | Source: Ambulatory Visit | Attending: Gastroenterology

## 2015-06-06 ENCOUNTER — Ambulatory Visit (HOSPITAL_COMMUNITY)
Admission: RE | Admit: 2015-06-06 | Discharge: 2015-06-06 | Disposition: A | Discharge status: Home / Self Care | Payer: Medicare Other | Source: Ambulatory Visit | Attending: Gastroenterology | Admitting: Gastroenterology

## 2015-06-06 ENCOUNTER — Ambulatory Visit (HOSPITAL_COMMUNITY): Payer: Medicare Other | Admitting: Anesthesiology

## 2015-06-06 ENCOUNTER — Encounter (HOSPITAL_COMMUNITY): Payer: Self-pay | Admitting: *Deleted

## 2015-06-06 DIAGNOSIS — F17210 Nicotine dependence, cigarettes, uncomplicated: Secondary | ICD-10-CM | POA: Insufficient documentation

## 2015-06-06 DIAGNOSIS — Z79899 Other long term (current) drug therapy: Secondary | ICD-10-CM | POA: Insufficient documentation

## 2015-06-06 DIAGNOSIS — K621 Rectal polyp: Secondary | ICD-10-CM | POA: Diagnosis not present

## 2015-06-06 DIAGNOSIS — K317 Polyp of stomach and duodenum: Secondary | ICD-10-CM | POA: Insufficient documentation

## 2015-06-06 DIAGNOSIS — K219 Gastro-esophageal reflux disease without esophagitis: Secondary | ICD-10-CM | POA: Diagnosis not present

## 2015-06-06 DIAGNOSIS — Z1211 Encounter for screening for malignant neoplasm of colon: Secondary | ICD-10-CM | POA: Diagnosis not present

## 2015-06-06 DIAGNOSIS — I10 Essential (primary) hypertension: Secondary | ICD-10-CM | POA: Diagnosis not present

## 2015-06-06 HISTORY — PX: COLONOSCOPY WITH PROPOFOL: SHX5780

## 2015-06-06 HISTORY — PX: ESOPHAGOGASTRODUODENOSCOPY (EGD) WITH PROPOFOL: SHX5813

## 2015-06-06 SURGERY — COLONOSCOPY WITH PROPOFOL
Anesthesia: Monitor Anesthesia Care

## 2015-06-06 MED ORDER — PROPOFOL 10 MG/ML IV BOLUS
INTRAVENOUS | Status: AC
  Filled 2015-06-06: qty 20

## 2015-06-06 MED ORDER — PROPOFOL 10 MG/ML IV BOLUS
INTRAVENOUS | Status: DC | PRN
  Administered 2015-06-06 (×5): 20 mg via INTRAVENOUS

## 2015-06-06 MED ORDER — LIDOCAINE HCL (CARDIAC) 20 MG/ML IV SOLN
INTRAVENOUS | Status: DC | PRN
  Administered 2015-06-06: 50 mg via INTRATRACHEAL

## 2015-06-06 MED ORDER — LIDOCAINE HCL (CARDIAC) 20 MG/ML IV SOLN
INTRAVENOUS | Status: AC
  Filled 2015-06-06: qty 5

## 2015-06-06 MED ORDER — LACTATED RINGERS IV SOLN
INTRAVENOUS | Status: DC
  Administered 2015-06-06: 1000 mL via INTRAVENOUS

## 2015-06-06 MED ORDER — PROPOFOL 10 MG/ML IV BOLUS
INTRAVENOUS | Status: AC
  Filled 2015-06-06: qty 60

## 2015-06-06 MED ORDER — PROPOFOL 500 MG/50ML IV EMUL
INTRAVENOUS | Status: DC | PRN
  Administered 2015-06-06: 100 ug/kg/min via INTRAVENOUS

## 2015-06-06 MED ORDER — SODIUM CHLORIDE 0.9 % IV SOLN: INTRAVENOUS | Status: DC

## 2015-06-06 SURGICAL SUPPLY — 24 items

## 2015-06-06 NOTE — Op Note (Signed)
[facility] Patient Name: Adam Mckenzie Procedure Date: 06/06/2015 MRN: [id number] Attending MD: Charolett Bumpers , MD Date of Birth: 15-Jul-1945 CSN:  Age: 70 Admit Type: Outpatient Procedure:                Upper GI endoscopy Indications:              Gastro-esophageal reflux disease Providers:                Charolett Bumpers, MD, Omelia Blackwater, RN, Arlee Muslim, Technician Referring MD:              Medicines:                Propofol per Anesthesia Complications:            No immediate complications. Estimated Blood Loss:     Estimated blood loss: none. Procedure:                Pre-Anesthesia Assessment:                           - Prior to the procedure, a History and Physical                            was performed, and patient medications and                            allergies were reviewed. The patient's tolerance of                            previous anesthesia was also reviewed. The risks                            and benefits of the procedure and the sedation                            options and risks were discussed with the patient.                            All questions were answered, and informed consent                            was obtained. Prior Anticoagulants: The patient has                            taken no previous anticoagulant or antiplatelet                            agents. ASA Grade Assessment: II - A patient with                            mild systemic disease. After reviewing the risks  and benefits, the patient was deemed in                            satisfactory condition to undergo the procedure.                           After obtaining informed consent, the endoscope was                            passed under direct vision. Throughout the                            procedure, the patient's blood pressure, pulse, and                            oxygen saturations  were monitored continuously. The                            [id number] ([id number] scope was introduced through the                            mouth, and advanced to the second part of duodenum.                            The upper GI endoscopy was accomplished without                            difficulty. The patient tolerated the procedure                            well. Scope In: Scope Out: Findings:      The Z-line was irregular and was found 40 cm from the incisors. This was       biopsied with a cold forceps for histology.      Multiple 5 to 10 mm pedunculated and sessile polyps with no bleeding and       no stigmata of recent bleeding were found in the gastric fundus. These       polyps were removed with a hot snare. Resection and retrieval were       complete. A few polyps were not retrieved      Multiple 5 to 10 mm sessile polyps with no bleeding and no stigmata of       recent bleeding were found in the gastric body. These polyps were       removed with a hot snare. Resection and retrieval were complete. A few       polyps were not retrieved.      The examined duodenum was normal. Impression:               - Z-line irregular, 40 cm from the incisors.                            Biopsied.                           - Multiple gastric polyps. Resected and retrieved.                           -  Multiple gastric polyps. Resected and retrieved.                           - Normal examined duodenum. Moderate Sedation:      N/A- Per Anesthesia Care Recommendation:           - Patient has a contact number available for                            emergencies. The signs and symptoms of potential                            delayed complications were discussed with the                            patient. Return to normal activities tomorrow.                            Written discharge instructions were provided to the                            patient.                           - Resume  previous diet.                           - Continue present medications. Procedure Code(s):        --- Professional ---                           [phone], Esophagogastroduodenoscopy, flexible,                            transoral; with removal of tumor(s), polyp(s), or                            other lesion(s) by snare technique Diagnosis Code(s):        --- Professional ---                           K31.7, Polyp of stomach and duodenum                           K21.9, Gastro-esophageal reflux disease without                            esophagitis CPT copyright 2016 American Medical Association. All rights reserved. The codes documented in this report are preliminary and upon coder review may  be revised to meet current compliance requirements. Danise Edge, MD Charolett Bumpers, MD 06/06/2015 9:06:29 AM This report has been signed electronically. Number of Addenda: 0

## 2015-06-06 NOTE — H&P (Signed)
  Procedure: Screening colonoscopy. 05/31/2005 normal screening colonoscopy was performed  History: The patient is a 70 year old male born [birth year]. He is scheduled to undergo a repeat screening colonoscopy today.  Medication allergies: Vicodin caused transient global amnesia  Past medical history: Hypertension. Gastroesophageal reflux unassociated with Barrett's esophagus. Bilateral cataracts. Pilonidal cyst surgery. Laser surgery to both eyes for vitreous separation. Bilateral cataract surgery. Retinal detachment surgery  Exam: The patient is alert and lying comfortably on the endoscopy stretcher. Abdomen is soft and nontender to palpation. Lungs are clear to auscultation. Cardiac exam reveals a regular rhythm.  Plan: Proceed with screening colonoscopy

## 2015-06-06 NOTE — Discharge Instructions (Signed)
Colonoscopy, Care After °These instructions give you information on caring for yourself after your procedure. Your doctor may also give you more specific instructions. Call your doctor if you have any problems or questions after your procedure. °HOME CARE °· Do not drive for 24 hours. °· Do not sign important papers or use machinery for 24 hours. °· You may shower. °· You may go back to your usual activities, but go slower for the first 24 hours. °· Take rest breaks often during the first 24 hours. °· Walk around or use warm packs on your belly (abdomen) if you have belly cramping or gas. °· Drink enough fluids to keep your pee (urine) clear or pale yellow. °· Resume your normal diet. Avoid heavy or fried foods. °· Avoid drinking alcohol for 24 hours or as told by your doctor. °· Only take medicines as told by your doctor. °If a tissue sample (biopsy) was taken during the procedure:  °· Do not take aspirin or blood thinners for 7 days, or as told by your doctor. °· Do not drink alcohol for 7 days, or as told by your doctor. °· Eat soft foods for the first 24 hours. °GET HELP IF: °You still have a small amount of blood in your poop (stool) 2-3 days after the procedure. °GET HELP RIGHT AWAY IF: °· You have more than a small amount of blood in your poop. °· You see clumps of tissue (blood clots) in your poop. °· Your belly is puffy (swollen). °· You feel sick to your stomach (nauseous) or throw up (vomit). °· You have a fever. °· You have belly pain that gets worse and medicine does not help. °MAKE SURE YOU: °· Understand these instructions. °· Will watch your condition. °· Will get help right away if you are not doing well or get worse. °  °This information is not intended to replace advice given to you by your health care provider. Make sure you discuss any questions you have with your health care provider. °  °Document Released: 03/31/2010 Document Revised: 03/03/2013 Document Reviewed: 11/03/2012 °Elsevier  Interactive Patient Education ©2016 [employer] ° °Esophagogastroduodenoscopy, Care After °Refer to this sheet in the next few weeks. These instructions provide you with information about caring for yourself after your procedure. Your health care provider may also give you more specific instructions. Your treatment has been planned according to current medical practices, but problems sometimes occur. Call your health care provider if you have any problems or questions after your procedure. °WHAT TO EXPECT AFTER THE PROCEDURE °After your procedure, it is typical to feel: °· Soreness in your throat. °· Pain with swallowing. °· Sick to your stomach (nauseous). °· Bloated. °· Dizzy. °· Fatigued. °HOME CARE INSTRUCTIONS °· Do not eat or drink anything until the numbing medicine (local anesthetic) has worn off and your gag reflex has returned. You will know that the local anesthetic has worn off when you can swallow comfortably. °· Do not drive or operate machinery until directed by your health care provider. °· Take medicines only as directed by your health care provider. °SEEK MEDICAL CARE IF:  °· You cannot stop coughing. °· You are not urinating at all or less than usual. °SEEK IMMEDIATE MEDICAL CARE IF: °· You have difficulty swallowing. °· You cannot eat or drink. °· You have worsening throat or chest pain. °· You have dizziness or lightheadedness or you faint. °· You have nausea or vomiting. °· You have chills. °· You have a fever. °·   You have severe abdominal pain. °· You have black, tarry, or bloody stools. °  °This information is not intended to replace advice given to you by your health care provider. Make sure you discuss any questions you have with your health care provider. °  °Document Released: 02/13/2012 Document Revised: 03/19/2014 Document Reviewed: 02/13/2012 °Elsevier Interactive Patient Education ©2016 [employer]. ° ° °Monitored Anesthesia Care °Monitored anesthesia care is an anesthesia service  for a medical procedure. Anesthesia is the loss of the ability to feel pain. It is produced by medicines called anesthetics. It may affect a small area of your body (local anesthesia), a large area of your body (regional anesthesia), or your entire body (general anesthesia). The need for monitored anesthesia care depends your procedure, your condition, and the potential need for regional or general anesthesia. It is often provided during procedures where:  °· General anesthesia may be needed if there are complications. This is because you need special care when you are under general anesthesia.   °· You will be under local or regional anesthesia. This is so that you are able to have higher levels of anesthesia if needed.   °· You will receive calming medicines (sedatives). This is especially the case if sedatives are given to put you in a semi-conscious state of relaxation (deep sedation). This is because the amount of sedative needed to produce this state can be hard to predict. Too much of a sedative can produce general anesthesia. °Monitored anesthesia care is performed by one or more health care providers who have special training in all types of anesthesia. You will need to meet with these health care providers before your procedure. During this meeting, they will ask you about your medical history. They will also give you instructions to follow. (For example, you will need to stop eating and drinking before your procedure. You may also need to stop or change medicines you are taking.) During your procedure, your health care providers will stay with you. They will:  °· Watch your condition. This includes watching your blood pressure, breathing, and level of pain.   °· Diagnose and treat problems that occur.   °· Give medicines if they are needed. These may include calming medicines (sedatives) and anesthetics.   °· Make sure you are comfortable.   °Having monitored anesthesia care does not necessarily mean that  you will be under anesthesia. It does mean that your health care providers will be able to manage anesthesia if you need it or if it occurs. It also means that you will be able to have a different type of anesthesia than you are having if you need it. When your procedure is complete, your health care providers will continue to watch your condition. They will make sure any medicines wear off before you are allowed to go home.  °  °This information is not intended to replace advice given to you by your health care provider. Make sure you discuss any questions you have with your health care provider. °  °Document Released: 11/22/2004 Document Revised: 03/19/2014 Document Reviewed: 04/09/2012 °Elsevier Interactive Patient Education ©2016 Elsevier Inc. ° °

## 2015-06-06 NOTE — Transfer of Care (Signed)
Immediate Anesthesia Transfer of Care Note  Patient: Adam Mckenzie  Procedure(s) Performed: Procedure(s): COLONOSCOPY WITH PROPOFOL (N/A) ESOPHAGOGASTRODUODENOSCOPY (EGD) WITH PROPOFOL (N/A)  Patient Location: PACU and Endoscopy Unit  Anesthesia Type:MAC  Level of Consciousness: sedated and patient cooperative  Airway & Oxygen Therapy: Patient Spontanous Breathing and Patient connected to nasal cannula oxygen  Post-op Assessment: Report given to RN and Post -op Vital signs reviewed and stable  Post vital signs: Reviewed and stable  Last Vitals:  Filed Vitals:   06/06/15 0647  BP: 169/80  Pulse: 62  Temp: 36.9 C  Resp: 20    Complications: No apparent anesthesia complications

## 2015-06-06 NOTE — Anesthesia Postprocedure Evaluation (Signed)
Anesthesia Post Note  Patient: Adam Mckenzie  Procedure(s) Performed: Procedure(s) (LRB): COLONOSCOPY WITH PROPOFOL (N/A) ESOPHAGOGASTRODUODENOSCOPY (EGD) WITH PROPOFOL (N/A)  Patient location during evaluation: Endoscopy Anesthesia Type: MAC Level of consciousness: awake and alert Pain management: pain level controlled Vital Signs Assessment: post-procedure vital signs reviewed and stable Respiratory status: spontaneous breathing, nonlabored ventilation, respiratory function stable and patient connected to nasal cannula oxygen Cardiovascular status: blood pressure returned to baseline and stable Postop Assessment: no signs of nausea or vomiting Anesthetic complications: no    Last Vitals:  Filed Vitals:   06/06/15 0647 06/06/15 0901  BP: 169/80 100/70  Pulse: 62   Temp: 36.9 C 36.9 C  Resp: 20 11    Last Pain: There were no vitals filed for this visit.               Sebastian Acheheodore Dorianne Perret

## 2015-06-06 NOTE — Op Note (Signed)
[facility] Patient Name: Adam Mckenzie Procedure Date: 06/06/2015 MRN: [id number] Attending MD: Charolett Bumpers , MD Date of Birth: 01-05-1946 CSN:  Age: 70 Admit Type: Outpatient Procedure:                Colonoscopy Indications:              Screening for colorectal malignant neoplasm Providers:                Charolett Bumpers, MD, Omelia Blackwater, RN, Arlee Muslim, Technician Referring MD:              Medicines:                Propofol per Anesthesia Complications:            No immediate complications. Estimated Blood Loss:     Estimated blood loss: none. Procedure:                Pre-Anesthesia Assessment:                           - Prior to the procedure, a History and Physical                            was performed, and patient medications and                            allergies were reviewed. The patient's tolerance of                            previous anesthesia was also reviewed. The risks                            and benefits of the procedure and the sedation                            options and risks were discussed with the patient.                            All questions were answered, and informed consent                            was obtained. Prior Anticoagulants: The patient has                            taken no previous anticoagulant or antiplatelet                            agents. ASA Grade Assessment: II - A patient with                            mild systemic disease. After reviewing the risks  and benefits, the patient was deemed in                            satisfactory condition to undergo the procedure.                           - Prior to the procedure, a History and Physical                            was performed, and patient medications and                            allergies were reviewed. The patient's tolerance of                            previous  anesthesia was also reviewed. The risks                            and benefits of the procedure and the sedation                            options and risks were discussed with the patient.                            All questions were answered, and informed consent                            was obtained. Prior Anticoagulants: The patient has                            taken no previous anticoagulant or antiplatelet                            agents. ASA Grade Assessment: II - A patient with                            mild systemic disease. After reviewing the risks                            and benefits, the patient was deemed in                            satisfactory condition to undergo the procedure.                           After obtaining informed consent, the colonoscope                            was passed under direct vision. Throughout the                            procedure, the patient's blood pressure, pulse, and  oxygen saturations were monitored continuously. The                            [id number] ([id number] scope was introduced through                            the anus and advanced to the the cecum, identified                            by appendiceal orifice and ileocecal valve. The                            colonoscopy was performed without difficulty. The                            patient tolerated the procedure well. The quality                            of the bowel preparation was good. The terminal                            ileum, the ileocecal valve, the appendiceal orifice                            and the rectum were photographed. Scope In: 8:26:32 AM Scope Out: 8:54:23 AM Scope Withdrawal Time: 0 hours 14 minutes 15 seconds  Total Procedure Duration: 0 hours 27 minutes 51 seconds  Findings:      The perianal and digital rectal examinations were normal.      A 4 mm polyp was found in the rectum. The polyp was sessile. The  polyp       was removed with a cold biopsy forceps. Resection and retrieval were       complete.      A 10 mm polypoid lesion was found in the rectum. The lesion was sessile.       No bleeding was present. This was biopsied with a cold forceps for       histology.      The exam was otherwise without abnormality. Impression:               - One 4 mm polyp in the rectum, removed with a cold                            biopsy forceps. Resected and retrieved.                           - Polypoid lesion in the rectum. Biopsied.                           - The examination was otherwise normal. Moderate Sedation:      N/A- Per Anesthesia Care Recommendation:           - Patient has a contact number available for  emergencies. The signs and symptoms of potential                            delayed complications were discussed with the                            patient. Return to normal activities tomorrow.                            Written discharge instructions were provided to the                            patient.                           - Resume previous diet.                           - Continue present medications.                           - Repeat colonoscopy for surveillance based on                            pathology results. Procedure Code(s):        --- Professional ---                           [phone], Colonoscopy, flexible; with biopsy, single                            or multiple Diagnosis Code(s):        --- Professional ---                           Z12.11, Encounter for screening for malignant                            neoplasm of colon                           K62.1, Rectal polyp                           D49.0, Neoplasm of unspecified behavior of                            digestive system CPT copyright 2016 American [employer] Association. All rights reserved. The codes documented in this report are preliminary and upon coder review may  be  revised to meet current compliance requirements. Danise Edge, MD Charolett Bumpers, MD 06/06/2015 9:15:24 AM This report has been signed electronically. Number of Addenda: 0

## 2015-06-07 ENCOUNTER — Encounter (HOSPITAL_COMMUNITY): Payer: Self-pay | Admitting: Gastroenterology

## 2015-07-08 ENCOUNTER — Other Ambulatory Visit: Payer: Self-pay | Admitting: Gastroenterology

## 2015-07-12 ENCOUNTER — Other Ambulatory Visit: Payer: Self-pay | Admitting: Gastroenterology

## 2015-07-13 ENCOUNTER — Ambulatory Visit (HOSPITAL_COMMUNITY)
Admission: RE | Admit: 2015-07-13 | Discharge: 2015-07-13 | Disposition: A | Discharge status: Home / Self Care | Payer: Medicare Other | Source: Ambulatory Visit | Attending: Gastroenterology | Admitting: Gastroenterology

## 2015-07-13 ENCOUNTER — Encounter (HOSPITAL_COMMUNITY): Payer: Self-pay | Admitting: *Deleted

## 2015-07-13 ENCOUNTER — Encounter (HOSPITAL_COMMUNITY)
Admission: RE | Disposition: A | Discharge status: Home / Self Care | Payer: Self-pay | Source: Ambulatory Visit | Attending: Gastroenterology

## 2015-07-13 DIAGNOSIS — K219 Gastro-esophageal reflux disease without esophagitis: Secondary | ICD-10-CM | POA: Diagnosis not present

## 2015-07-13 DIAGNOSIS — K6289 Other specified diseases of anus and rectum: Secondary | ICD-10-CM | POA: Diagnosis not present

## 2015-07-13 DIAGNOSIS — Z79899 Other long term (current) drug therapy: Secondary | ICD-10-CM | POA: Insufficient documentation

## 2015-07-13 DIAGNOSIS — I10 Essential (primary) hypertension: Secondary | ICD-10-CM | POA: Insufficient documentation

## 2015-07-13 HISTORY — PX: EUS: SHX5427

## 2015-07-13 SURGERY — ULTRASOUND, LOWER GI TRACT, ENDOSCOPIC
Anesthesia: Moderate Sedation

## 2015-07-13 MED ORDER — MIDAZOLAM HCL 10 MG/2ML IJ SOLN
INTRAMUSCULAR | Status: DC | PRN
  Administered 2015-07-13 (×2): 2 mg via INTRAVENOUS

## 2015-07-13 MED ORDER — FENTANYL CITRATE (PF) 100 MCG/2ML IJ SOLN
INTRAMUSCULAR | Status: DC | PRN
  Administered 2015-07-13 (×2): 25 ug via INTRAVENOUS

## 2015-07-13 MED ORDER — FENTANYL CITRATE (PF) 100 MCG/2ML IJ SOLN
INTRAMUSCULAR | Status: AC
  Filled 2015-07-13: qty 2

## 2015-07-13 MED ORDER — MIDAZOLAM HCL 5 MG/ML IJ SOLN
INTRAMUSCULAR | Status: AC
  Filled 2015-07-13: qty 2

## 2015-07-13 MED ORDER — SODIUM CHLORIDE 0.9 % IV SOLN: INTRAVENOUS | Status: DC

## 2015-07-13 NOTE — H&P (Signed)
Patient interval history reviewed.  Patient examined again.  There has been no change from documented H/P dated 06/16/15 (scanned into chart from our office) except as documented above.  Assessment:  1.  Distal rectal nodule, suspected submucosal vessel, mucosal biopsies showing prolapse-type tissue.  Plan:  1.  Endorectal ultrasound. 2.  Risks (bleeding, infection, bowel perforation that could require surgery, sedation-related changes in cardiopulmonary systems), benefits (identification and possible treatment of source of symptoms, exclusion of certain causes of symptoms), and alternatives (watchful waiting, radiographic imaging studies, empiric medical treatment) of endorectal ultrasound were explained to patient/family in detail and patient wishes to proceed.

## 2015-07-13 NOTE — Discharge Instructions (Signed)
Flexible Sigmoidoscopy with endorectal ultrasound, Care After Refer to this sheet in the next few weeks. These instructions provide you with information on caring for yourself after your procedure. Your health care provider may also give you more specific instructions. Your treatment has been planned according to current medical practices, but problems sometimes occur. Call your health care provider if you have any problems or questions after your procedure. WHAT TO EXPECT AFTER THE PROCEDURE After your procedure, it is typical to have the following:   Abdominal cramps.  Bloating.  A small amount of rectal bleeding if you had a biopsy. HOME CARE INSTRUCTIONS  Only take over-the-counter or prescription medicines for pain, fever, or discomfort as directed by your health care provider.  Resume your normal diet and activities as directed by your health care provider. SEEK MEDICAL CARE IF:  You have abdominal pain or cramping that lasts longer than 1 hour after the procedure.  You continue to have small amounts of rectal bleeding after 24 hours.  You have nausea or vomiting.  You feel weak or dizzy. SEEK IMMEDIATE MEDICAL CARE IF:   You have a fever.  You pass large blood clots or see a large amount of blood in the toilet after having a bowel movement. This may also occur 10-14 days after the procedure. It is more likely if you had a biopsy.  You develop abdominal pain that is not relieved with medicine or your abdominal pain gets worse.  You have nausea or vomiting for more than 24 hours after the procedure.   This information is not intended to replace advice given to you by your health care provider. Make sure you discuss any questions you have with your health care provider.   Document Released: 03/03/2013 Document Reviewed: 03/03/2013 Elsevier Interactive Patient Education [employer]2016 Elsevier Inc.

## 2015-07-13 NOTE — Op Note (Signed)
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 Estimated Blood Loss:     Estimated blood loss: none. Procedure:                Pre-Anesthesia Assessment:                           - Prior to the procedure, a History and Physical                            was performed, and patient medications and                            allergies were reviewed. The patient's tolerance of                            previous anesthesia was also reviewed. The risks                            and benefits of the procedure and the sedation                            options and risks were discussed with the patient.                            All questions were answered, and informed consent                            was obtained. Prior Anticoagulants: The patient has                            taken no previous anticoagulant or antiplatelet                            agents. ASA Grade Assessment: II - A patient with  mild systemic disease. After reviewing the risks                            and benefits, the patient was deemed in                            satisfactory condition to undergo the procedure.                           After obtaining informed consent, the endoscope was                            passed under direct vision. Throughout the   procedure, the patient's blood pressure, pulse, and                            oxygen saturations were monitored continuously. The                            [id number] ([id number]) scope was introduced through                            the anus and advanced to the the rectosigmoid                            junction. The lower EUS was accomplished without                            difficulty. The patient tolerated the procedure                            well. The quality of the bowel preparation was good. Scope In: Scope Out: Findings:      Endoscopic Finding :      One small mucosal nodule was found in the distal rectum.      Endosonographic Finding :      A round intramural (subepithelial) lesion was found in the rectum. The       lesion was encountered at 1 cm (from the anal verge). The lesion was       non-circumferential and located predominantly at the anterior rectal       wall. The lesion was hypoechoic. Sonographically, the origin appeared to       be within the luminal interface/superficial mucosa (Layer 1) and deep       mucosa (Layer 2). The endosonographic borders were well-defined. An       intact interface was seen between the lesion and the prostate suggesting       a lack of invasion.      No lymph nodes were seen in the perirectal region. Impression:               - Mucosal nodule in the distal rectum. Suspect, as                            mucosal biopsies implied, prolapse-type polyp. This  does not look like a blood vessel. Reparative                            changes from prior hemorrhoid surgery or even                            fibroepithelial polyp are other considerations, but                            these considerations not borne out with recent                            mucosal biopsies. There was some blood flow deep to                            the nodule, but I suspect this was more from the                             hemorrhoidal veins rather than the nodule itself.                           - An intramural (subepithelial) lesion was                            visualized endosonographically in the rectum. The                            origin of the lesion appeared to be within the                            luminal interface/superficial mucosa (Layer 1) and                            deep mucosa (Layer 2).                           - No lymph nodes were seen in the perirectal region                            during endosonographic examination. Moderate Sedation:      Moderate (conscious) sedation was administered by the endoscopy nurse       and supervised by the endoscopist. The following parameters were       monitored: oxygen saturation, heart rate, blood pressure, and response       to care. Recommendation:           - Discharge patient to home (via wheelchair).                           - Resume previous diet today.                           - Continue present medications.                           -  Return to GI clinic in 1 year. Since atypia was                            seen on recent mucosal biopsies, might consider                            surveillance with sigmoidoscopy in one year for                            repeat biopsies.                           - Return to referring physician as previously                            scheduled. Procedure Code(s):        --- Professional ---                           [phone], Sigmoidoscopy, flexible; with endoscopic                            ultrasound examination Diagnosis Code(s):        --- Professional ---                           K62.89, Other specified diseases of anus and rectum CPT copyright 2016 American Medical Association. All rights reserved. The codes documented in this report are preliminary and upon coder review may  be revised to meet current compliance requirements. Willis Modena, MD 07/13/2015 8:10:21 AM This report has  been signed electronically. Number of Addenda: 0

## 2015-07-17 ENCOUNTER — Encounter (HOSPITAL_COMMUNITY): Payer: Self-pay | Admitting: Gastroenterology

## 2015-11-17 ENCOUNTER — Other Ambulatory Visit: Payer: Self-pay | Admitting: Orthopaedic Surgery

## 2015-11-17 DIAGNOSIS — M25562 Pain in left knee: Secondary | ICD-10-CM

## 2015-11-18 ENCOUNTER — Ambulatory Visit
Admission: RE | Admit: 2015-11-18 | Discharge: 2015-11-18 | Disposition: A | Discharge status: Home / Self Care | Payer: Medicare Other | Source: Ambulatory Visit | Attending: Orthopaedic Surgery | Admitting: Orthopaedic Surgery

## 2015-11-18 DIAGNOSIS — M25562 Pain in left knee: Secondary | ICD-10-CM

## 2015-11-19 ENCOUNTER — Other Ambulatory Visit: Payer: Self-pay | Admitting: Orthopaedic Surgery

## 2015-11-19 DIAGNOSIS — M25562 Pain in left knee: Secondary | ICD-10-CM

## 2016-02-22 ENCOUNTER — Ambulatory Visit (INDEPENDENT_AMBULATORY_CARE_PROVIDER_SITE_OTHER): Payer: Medicare Other | Admitting: Licensed Clinical Social Worker

## 2016-02-22 DIAGNOSIS — F419 Anxiety disorder, unspecified: Secondary | ICD-10-CM | POA: Diagnosis not present

## 2016-03-12 HISTORY — PX: VITRECTOMY: SHX106

## 2016-03-20 ENCOUNTER — Ambulatory Visit (INDEPENDENT_AMBULATORY_CARE_PROVIDER_SITE_OTHER): Payer: 59 | Admitting: Licensed Clinical Social Worker

## 2016-03-20 DIAGNOSIS — F419 Anxiety disorder, unspecified: Secondary | ICD-10-CM | POA: Diagnosis not present

## 2016-04-13 ENCOUNTER — Ambulatory Visit: Payer: Self-pay | Admitting: Surgery

## 2016-04-13 NOTE — H&P (Signed)
Adam Mckenzie 04/13/2016 10:44 AM Location: [facility] Patient #: [id number] DOB: February 27, 1946 Married / Language: Adam Mckenzie / Race: White Male  History of Present Illness Adam Mckenzie(Adam Mckenzie; 04/13/2016 11:23 AM) Patient words: Patient returns for follow-up of anal pain. He's had some drainage just posterior to his anal canal disease. This is present off and on for at least a year but has become more bothersome over the last few weeks.  The patient is a 71 year old male.   Allergies Adam Mckenzie([name]; 04/13/2016 10:45 AM) No Known Drug Allergies 01/23/2016  Medication History Adam Mckenzie(Adam Adam [name]; 04/13/2016 10:46 AM) Losartan Potassium-HCTZ (100-12.5MG  Tablet, Oral daily) Active. PriLOSEC (20MG  Capsule DR, Oral daily) Active. Turmeric (450MG  Capsule, Oral daily) Active. Medications Reconciled    Vitals Adam Mckenzie(Adam Adam Mckenzie CMA; 04/13/2016 10:46 AM) 04/13/2016 10:46 AM Weight: 174.4 lb Height: 68in Body Surface Area: 1.93 m Body Mass Index: 26.52 kg/m  Temp.: 98.55F  Pulse: 89 (Regular)  BP: 134/78 (Sitting, Left Arm, Standard)      Physical Exam (Adam Mckenzie; 04/13/2016 11:24 AM)  General Mental Status-Alert. General Appearance-Consistent with stated age. Hydration-Well hydrated. Voice-Normal.  Head and Neck Head-normocephalic, atraumatic with no lesions or palpable masses. Trachea-midline. Thyroid Gland Characteristics - normal size and consistency.  Chest and Lung Exam Chest and lung exam reveals -quiet, even and easy respiratory effort with no use of accessory muscles and on auscultation, normal breath sounds, no adventitious sounds and normal vocal resonance. Inspection Chest Wall - Normal. Back - normal.  Cardiovascular Cardiovascular examination reveals -normal heart sounds, regular rate and rhythm with no murmurs and normal pedal pulses bilaterally.  Rectal Note: In the posterior midline 1 cm from the anal verge  is a 5 mm opening. There is no drainage from this. She enters intact with good tone. No rectal masses noted. No evidence of abscess. Scar from previous pilonidal surgery noted.  Neurologic Neurologic evaluation reveals -alert and oriented x 3 with no impairment of recent or remote memory. Mental Status-Normal.  Musculoskeletal Normal Exam - Left-Upper Extremity Strength Normal and Lower Extremity Strength Normal. Normal Exam - Right-Upper Extremity Strength Normal and Lower Extremity Strength Normal.    Assessment & Plan (Adam Mckenzie; 04/13/2016 11:25 AM)  ANAL FISTULA (K60.3) Impression: Recommend exam under anesthesia with possible fistulotomy. Discussed different techniques of repair of this problem. Discussed the potential risk of incontinence of being roughly 1% after surgery. Discussed nonoperative management of this problem. Discussed use of a fistula plug versus advancement flap closure versus simple excision or seton placement. He wishes to proceed since this is not getting better. Risk of bleeding, infection, fecal incontinence, revisional surgery, damage to neighboring structures, chronic wound, the need further procedures and her treatments discussed. Other risks of MI DVT death  Current Plans Pt Education - [facility] Abscess/Fistula (AT): discussed with patient and provided information. SUBCUTANEOUS SURGICAL TREATMENT OF ANAL FISTULA ([id number]) Pt Education - [facility] Rectal Surgery HCI (Gross): discussed with patient and provided information. You are being scheduled for surgery- Our schedulers will call you.  You should hear from our office's scheduling department within 5 working days about the location, date, and time of surgery. We try to make accommodations for patient's preferences in scheduling surgery, but sometimes the OR schedule or the surgeon's schedule prevents us from making those accommodations.  If you have not heard from our office [phone]) in 5  working days, call the office and ask for your surgeon's nurse.  If you have  other questions about your diagnosis, plan, or surgery, call the office and ask for your surgeon's nurse.  The anatomy & physiology of the anorectal region was discussed. We discussed the pathophysiology of anorectal abscess and fistula. Differential diagnosis was discussed. Natural history progression was discussed. I stressed the importance of a bowel regimen to have daily soft bowel movements to minimize progression of disease.  The patient's condition is not adequately controlled. Non-operative treatment has not healed the fistula. Therefore, I recommended examination under anaesthesia to confirm the diagnosis and treat the fistula. I discussed techniques that may be required such as fistulotomy, ligation by LIFT technique, and/or seton placement. Benefits & alternatives discussed. I noted a good likelihood this will help address the problem, but sometimes repeat operations and prolonged healing times may occur. Risks such as bleeding, pain, recurrence, reoperation, incontinence, heart attack, death, and other risks were discussed.  Educational handouts further explaining the pathology, treatment options, and bowel regimen were given. The patient expressed understanding & wishes to proceed. We will work to coordinate surgery for a mutually convenient time.

## 2016-07-06 ENCOUNTER — Other Ambulatory Visit (HOSPITAL_COMMUNITY): Payer: Self-pay | Admitting: Internal Medicine

## 2016-07-06 DIAGNOSIS — R002 Palpitations: Secondary | ICD-10-CM

## 2016-07-20 ENCOUNTER — Ambulatory Visit (HOSPITAL_COMMUNITY)
Admission: RE | Admit: 2016-07-20 | Discharge: 2016-07-20 | Disposition: A | Discharge status: Home / Self Care | Payer: Medicare Other | Source: Ambulatory Visit | Attending: Internal Medicine | Admitting: Internal Medicine

## 2016-07-20 ENCOUNTER — Ambulatory Visit (HOSPITAL_COMMUNITY): Admission: RE | Admit: 2016-07-20 | Payer: Medicare Other | Source: Ambulatory Visit

## 2016-07-20 DIAGNOSIS — R002 Palpitations: Secondary | ICD-10-CM | POA: Diagnosis not present

## 2016-07-20 DIAGNOSIS — I493 Ventricular premature depolarization: Secondary | ICD-10-CM | POA: Diagnosis present

## 2016-07-20 HISTORY — PX: TRANSTHORACIC ECHOCARDIOGRAM: SHX275

## 2016-07-20 NOTE — Progress Notes (Signed)
  Echocardiogram 2D Echocardiogram has been performed.  Delcie RochENNINGTON, Equan Cogbill 07/20/2016, 1:43 PM

## 2016-07-24 ENCOUNTER — Encounter (INDEPENDENT_AMBULATORY_CARE_PROVIDER_SITE_OTHER): Payer: Self-pay

## 2016-07-24 ENCOUNTER — Ambulatory Visit (INDEPENDENT_AMBULATORY_CARE_PROVIDER_SITE_OTHER): Payer: Medicare Other | Admitting: Cardiology

## 2016-07-24 ENCOUNTER — Encounter: Payer: Self-pay | Admitting: Cardiology

## 2016-07-24 VITALS — BP 138/78 | HR 61 | Ht 69.0 in | Wt 174.1 lb

## 2016-07-24 DIAGNOSIS — R002 Palpitations: Secondary | ICD-10-CM | POA: Diagnosis not present

## 2016-07-24 DIAGNOSIS — I493 Ventricular premature depolarization: Secondary | ICD-10-CM

## 2016-07-24 DIAGNOSIS — R5383 Other fatigue: Secondary | ICD-10-CM

## 2016-07-24 NOTE — Patient Instructions (Signed)
Medication Instructions:  You may take Metoprolol 25 mg, up to 2 a day for palpitations. Continue all other medications.  Follow-Up: Follow up as needed with Dr Anne FuSkains  If you need a refill on your cardiac medications before your next appointment, please call your pharmacy.  Thank you for choosing [facility]!!

## 2016-07-24 NOTE — Progress Notes (Signed)
Cardiology Office Note:    Date:  07/24/2016   ID:  Garland, [place] Feb 17, 1946, MRN [id number]  PCP:  Marden Noble, MD  Cardiologist:  [name], MD   Referring MD: Marden Noble, MD     History of Present Illness:    Adam Mckenzie is a 71 y.o. male is here for evaluation of palpitations at the request of Dr. Kevan Ny. In review of prior notes, he has known immature ventricular contractions. He recently walked approximate 2 miles and did not have chest discomfort. He's been noticing more palpitations and his metoprolol does not seem to be helping. He is having increased fatigue as well. Dr. Kevan Ny was concerned about his fatigue and ordered a treadmill stress test as well as echocardiogram. His echocardiogram performed on 07/20/16 shows normal ejection fraction, grade 1 diastolic dysfunction, no other abnormalities. No significant change from prior echocardiogram. Reassuring.  His EKG personally viewed from 04/06/16 shows 3 PVCs, sinus rhythm heart rate 70.  He notes that he does not drink excessively, no significant caffeine use, one cup of coffee a day. He does not take any decongestants.  His palpitations were first noted a year ago.  Past Medical History:  Diagnosis Date  . Arthritis    osteoarthritis -hands  . GERD (gastroesophageal reflux disease)   . Hypertension   . Retinal detachment   . Tendonitis    left shoulder    Past Surgical History:  Procedure Laterality Date  . CATARACT EXTRACTION, BILATERAL    . COLONOSCOPY WITH PROPOFOL N/A 06/06/2015   Procedure: COLONOSCOPY WITH PROPOFOL;  Surgeon: Charolett Bumpers, MD;  Location: [facility];  Service: Endoscopy;  Laterality: N/A;  . ESOPHAGOGASTRODUODENOSCOPY (EGD) WITH PROPOFOL N/A 06/06/2015   Procedure: ESOPHAGOGASTRODUODENOSCOPY (EGD) WITH PROPOFOL;  Surgeon: Charolett Bumpers, MD;  Location: [facility];  Service: Endoscopy;  Laterality: N/A;  . EUS N/A 07/13/2015   Procedure: LOWER ENDOSCOPIC ULTRASOUND (EUS);   Surgeon: Willis Modena, MD;  Location: [facility];  Service: Endoscopy;  Laterality: N/A;  . EYE SURGERY  09/09/2013   retinal detachment surgery  . left shoulder injection  10/07/13  . TONSILLECTOMY    . VASECTOMY      Current Medications: Current Meds  Medication Sig  . losartan-hydrochlorothiazide (HYZAAR) 100-12.5 MG per tablet Take 1 tablet by mouth every morning.  . metoprolol tartrate (LOPRESSOR) 25 MG tablet Take 25 mg by mouth 2 (two) times daily as needed (palpitations).  Marland Kitchen omeprazole (PRILOSEC) 20 MG capsule Take 20 mg by mouth every morning.  . TURMERIC PO Take 1 capsule by mouth daily.     Allergies:   Hydrocodone   Social History   Social History  . Marital status: Married    Spouse name: N/A  . Number of children: N/A  . Years of education: N/A   Social History Main Topics  . Smoking status: Never Smoker  . Smokeless tobacco: Never Used     Comment: rare cigar use  . Alcohol use Yes     Comment: 2-3 beers per day and glass of wine  . Drug use: No  . Sexual activity: Not Asked   Other Topics Concern  . None   Social History Narrative   Lives with wife.  Moved from [place] 1 year ago due to financial strain and living in house that is in a trust for his brother who is in prison and being released later this year.  His mother lives in a house next door which is  in a trust for him.  They are financially insecure.  His wife is teaching at [employer].  They lost a family friend a few weeks ago.  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       Family History: The patient's family history includes Alcoholism in his paternal uncle; Bipolar disorder in his brother; Depression in his mother; Heart disease in his father. There is no history of Stroke or Cancer. Father died at age 72 from CAD.  ROS:   Please see the history of present illness.    Denies syncope, no chest pain, no shortness of breath. All other systems reviewed and are  negative.  EKGs/Labs/Other Studies Reviewed:    The following studies were reviewed today: Prior office notes, EKG, lab work, prior hospitalization reviewed  EKG:  EKG is  ordered today.  The ekg ordered today demonstrates 61 sinus rhythm with single PVC, left axis deviation, left atrial enlargement.  Recent Labs: No results found for requested labs within last 8760 hours.   Recent Lipid Panel    Component Value Date/Time   CHOL 251 (H) 10/09/2013 0254   TRIG 233 (H) 10/09/2013 0254   HDL 77 10/09/2013 0254   CHOLHDL 3.3 10/09/2013 0254   VLDL 47 (H) 10/09/2013 0254   LDLCALC 127 (H) 10/09/2013 0254    Physical Exam:    VS:  BP 138/78   Pulse 61   Ht 5\' 9"  (1.753 m)   Wt 174 lb 2 oz (79 kg)   SpO2 96%   BMI 25.71 kg/m     Wt Readings from Last 3 Encounters:  07/24/16 174 lb 2 oz (79 kg)  07/13/15 167 lb (75.8 kg)  06/06/15 167 lb (75.8 kg)     GEN:  Well nourished, well developed in no acute distress HEENT: Normal NECK: No JVD; No carotid bruits LYMPHATICS: No lymphadenopathy CARDIAC: RRR, no murmurs, rubs, gallops, Occasional ectopy RESPIRATORY:  Clear to auscultation without rales, wheezing or rhonchi  ABDOMEN: Soft, non-tender, non-distended MUSCULOSKELETAL:  No edema; No deformity  SKIN: Warm and dry NEUROLOGIC:  Alert and oriented x 3 PSYCHIATRIC:  Normal affect   ASSESSMENT:    1. Palpitations   2. Premature ventricular contractions   3. Other fatigue    PLAN:    In order of problems listed above:  PVCs  - More frequent PVCs were noted on ECG, 3 of them. Taking low-dose metoprolol 25 mg once a day.  - Thankfully, ejection fraction is normal. There is no evidence of PVC induced cardiomyopathy.  - Would suggest increasing Toprol from 25 mg to 50 mg. Hopefully this will help suppress his PVCs. Overall, his ejection fraction is normal and reassuring. I asked my name as well that occasionally when people have excessive palpitations they may request  antiarrhythmic medication such as flecainide but we do not need to proceed in this direction. Also ablative therapies can take place if a tremendous amount of PVCs are noted (15-20% of heart beats per day).  Fatigue/palpitations  - It was requested that he obtain a treadmill stress test. EKG is interpretable. When I discussed this with him he states he is no longer feeling fatigued, not having any chest pain and at this time does not desire to proceed with Trammell test. I think this is reasonable given his lack of anginal symptoms.  - In review of his prior medical history, in July 2015 he was hospitalized with transient global amnesia possible TIA  versus narcotic effect. Hyponatremia was noted but resolved.      Medication Adjustments/Labs and Tests Ordered: Current medicines are reviewed at length with the patient today.  Concerns regarding medicines are outlined above. Labs and tests ordered and medication changes are outlined in the patient instructions below:  Patient Instructions  Medication Instructions:  You may take Metoprolol 25 mg, up to 2 a day for palpitations. Continue all other medications.  Follow-Up: Follow up as needed with Dr Anne FuSkains  If you need a refill on your cardiac medications before your next appointment, please call your pharmacy.  [facility] you [facility] choosing [facility]!!        Signed, Donato SchultzMark Ausha Sieh, MD  07/24/2016 2:32 PM    [facility]

## 2016-08-20 ENCOUNTER — Telehealth: Payer: Self-pay | Admitting: Cardiology

## 2016-08-20 NOTE — Telephone Encounter (Signed)
New message   Pt is calling about his metoprolol. He is calling asking if there is another beta blocker that he can be put on because he is not pleased with this medication. He states he still has too many missed beats.

## 2016-08-20 NOTE — Telephone Encounter (Signed)
Patient called to report he does not think his Metoprolol is effective anymore. He is currently taking Metoprolol 25 mg BID.  When he first started taking the medication, he said his PVCs were suppressed. When he started getting the PVCs again, he was increased to 25 mg BID at OV 5/15. Lately, he is having more and more PVCs and wanted to let Dr. Anne FuSkains know. He requests to change medications to something that will suppress his PVCs since he doesn't want damage to his heart. He reports his BP is "fine" and his HR usually runs in the 60s.  To Dr. Anne FuSkains for recommendations.

## 2016-08-23 NOTE — Telephone Encounter (Signed)
Stop metoprolol Start Diltiazem CD 180mg  PO QD  Donato SchultzMark Skains, MD

## 2016-08-24 MED ORDER — DILTIAZEM HCL ER COATED BEADS 180 MG PO CP24: 180.0000 mg | ORAL_CAPSULE | Freq: Every day | ORAL | 3 refills | Status: DC

## 2016-08-24 NOTE — Telephone Encounter (Signed)
Pt aware to discontinue Metoprolol and start Dilt 180 mg daily.  Rx sent into [facility] as requested.

## 2017-03-19 ENCOUNTER — Encounter: Payer: Self-pay | Admitting: Physician Assistant

## 2017-03-19 ENCOUNTER — Ambulatory Visit: Payer: Medicare Other | Admitting: Physician Assistant

## 2017-03-19 ENCOUNTER — Encounter (INDEPENDENT_AMBULATORY_CARE_PROVIDER_SITE_OTHER): Payer: Self-pay

## 2017-03-19 VITALS — BP 132/74 | HR 53 | Ht 69.0 in | Wt 170.8 lb

## 2017-03-19 DIAGNOSIS — R002 Palpitations: Secondary | ICD-10-CM | POA: Insufficient documentation

## 2017-03-19 DIAGNOSIS — I10 Essential (primary) hypertension: Secondary | ICD-10-CM

## 2017-03-19 DIAGNOSIS — I639 Cerebral infarction, unspecified: Secondary | ICD-10-CM

## 2017-03-19 MED ORDER — CARVEDILOL 3.125 MG PO TABS: 3.1250 mg | ORAL_TABLET | Freq: Two times a day (BID) | ORAL | 3 refills | Status: DC

## 2017-03-19 NOTE — Progress Notes (Signed)
Cardiology Office Note    Date:  03/19/2017   ID:  Adam, Mckenzie 08-24-1945, MRN [id number]  PCP:  Marden Noble, MD  Cardiologist: Dr. Anne Fu  No chief complaint on file.   History of Present Illness:  Adam Mckenzie is a 72 y.o. male who saw Dr. Anne Fu once in 07/24/16 for palpitations and known PVCs.  He did not feel like metoprolol was helping and he was having increased fatigue.  2D echo showed normal LVEF grade 1 DD.  Dr. Anne Fu recommended increasing Toprol to 50 mg daily.  He was eventually changed to diltiazem 180 mg daily.  GXT was recommended but the patient says he was no longer fatigued and not having chest pain so he did not want to proceed with this.  Patient comes in today for follow-up.  He said a couple weeks ago he tried to Diltiazem  for the first time and had a significant reaction.  He had worse palpitations dizziness and felt terrible.  He wants an alternative.  He checks his pulse frequently and counts his missed beats.  He drinks 1-1/2 cups of coffee daily but gets no other stimulants.  He had labs drawn by Dr. Kevan Ny last week.  The gym and exercises regularly without problems.  Denies any chest pain.    Past Medical History:  Diagnosis Date  . Arthritis    osteoarthritis -hands  . GERD (gastroesophageal reflux disease)   . Hypertension   . Retinal detachment   . Tendonitis    left shoulder    Past Surgical History:  Procedure Laterality Date  . CATARACT EXTRACTION, BILATERAL    . COLONOSCOPY WITH PROPOFOL N/A 06/06/2015   Procedure: COLONOSCOPY WITH PROPOFOL;  Surgeon: Charolett Bumpers, MD;  Location: [facility];  Service: Endoscopy;  Laterality: N/A;  . ESOPHAGOGASTRODUODENOSCOPY (EGD) WITH PROPOFOL N/A 06/06/2015   Procedure: ESOPHAGOGASTRODUODENOSCOPY (EGD) WITH PROPOFOL;  Surgeon: Charolett Bumpers, MD;  Location: [facility];  Service: Endoscopy;  Laterality: N/A;  . EUS N/A 07/13/2015   Procedure: LOWER ENDOSCOPIC ULTRASOUND (EUS);  Surgeon:  Willis Modena, MD;  Location: [facility];  Service: Endoscopy;  Laterality: N/A;  . EYE SURGERY  09/09/2013   retinal detachment surgery  . left shoulder injection  10/07/13  . TONSILLECTOMY    . VASECTOMY      Current Medications: Current Meds  Medication Sig  . clonazePAM (KLONOPIN) 1 MG tablet Take 1 mg by mouth daily as needed for anxiety.  Marland Kitchen losartan-hydrochlorothiazide (HYZAAR) 100-12.5 MG per tablet Take 0.5 tablets by mouth every morning.   . Omega-3 Fatty Acids (FISH OIL) 1000 MG CAPS Take 1,000 mg by mouth daily.  Marland Kitchen omeprazole (PRILOSEC) 20 MG capsule Take 20 mg by mouth every morning.  . TURMERIC PO Take 1 capsule by mouth daily.     Allergies:   Hydrocodone   Social History   Socioeconomic History  . Marital status: Married    Spouse name: None  . Number of children: None  . Years of education: None  . Highest education level: None  Social Needs  . Financial resource strain: None  . Food insecurity - worry: None  . Food insecurity - inability: None  . Transportation needs - medical: None  . Transportation needs - non-medical: None  Occupational History  . None  Tobacco Use  . Smoking status: Never Smoker  . Smokeless tobacco: Never Used  . Tobacco comment: rare cigar use  Substance and Sexual Activity  . Alcohol use:  Yes    Comment: 2-3 beers per day and glass of wine  . Drug use: No  . Sexual activity: None  Other Topics Concern  . None  Social History Narrative   Lives with wife.  [place] ago due to financial strain and living in house that is in a trust for his brother who is in prison and being released later this year.  His mother lives in a house next door which is in a trust for him.  They are financially insecure.  His wife is teaching at [employer].  They lost a family friend a few weeks ago.  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       Family History:  The patient's family history includes  Alcoholism in his paternal uncle; Bipolar disorder in his brother; Depression in his mother; Heart disease in his father.   ROS:   Please see the history of present illness.    Review of Systems  Constitution: Negative.  HENT: Negative.   Cardiovascular: Positive for palpitations.  Respiratory: Negative.   Endocrine: Negative.   Hematologic/Lymphatic: Negative.   Musculoskeletal: Negative.   Gastrointestinal: Negative.   Genitourinary: Negative.   Neurological: Negative.    All other systems reviewed and are negative.   PHYSICAL EXAM:   VS:  BP 132/74   Pulse (!) 53   Ht 5\' 9"  (1.753 m)   Wt 170 lb 12.8 oz (77.5 kg)   BMI 25.22 kg/m   Physical Exam  GEN: Well nourished, well developed, in no acute distress  Neck: no JVD, carotid bruits, or masses Cardiac:RRR; some skipping no murmurs, rubs, or gallops  Respiratory:  clear to auscultation bilaterally, normal work of breathing GI: soft, nontender, nondistended, + BS Ext: without cyanosis, clubbing, or edema, Good distal pulses bilaterally Neuro:  Alert and Oriented x 3 Psych: euthymic mood, full affect  Wt Readings from Last 3 Encounters:  03/19/17 170 lb 12.8 oz (77.5 kg)  07/24/16 174 lb 2 oz (79 kg)  07/13/15 167 lb (75.8 kg)      Studies/Labs Reviewed:   EKG:  EKG is not ordered today.     Recent Labs: No results found for requested labs within last 8760 hours.   Lipid Panel    Component Value Date/Time   CHOL 251 (H) 10/09/2013 0254   TRIG 233 (H) 10/09/2013 0254   HDL 77 10/09/2013 0254   CHOLHDL 3.3 10/09/2013 0254   VLDL 47 (H) 10/09/2013 0254   LDLCALC 127 (H) 10/09/2013 0254    Additional studies/ records that were reviewed today include:   2D echo 5/11/18Study Conclusions   - Left ventricle: The cavity size was normal. Wall thickness was   normal. Systolic function was normal. The estimated ejection   fraction was in the range of 60% to 65%. Wall motion was normal;   there were no regional  wall motion abnormalities. Doppler   parameters are consistent with abnormal left ventricular   relaxation (grade 1 diastolic dysfunction).   Impressions:   - Compared to the prior study, there has been no significant   interval change.    ASSESSMENT:    1. Essential hypertension   2. Palpitations   3. Cerebrovascular accident (CVA), unspecified mechanism (HCC)      PLAN:  In order of problems listed above:  Palpitations patient complained of significant fatigue on Toprol all.  He had a reaction to the diltiazem and wants an  alternative.  We will try low-dose carvedilol and titrate up as needed for his palpitations.  Follow-up with Dr. Anne Fu in 2 months.  Avoid stimulants.  Not interested in GXT at this time.  Also offered a Holter monitor in the future if he has worsening symptoms.  Essential hypertension controlled with low-dose losartan/HCT Z  Prior CVA.    Medication Adjustments/Labs and Tests Ordered: Current medicines are reviewed at length with the patient today.  Concerns regarding medicines are outlined above.  Medication changes, Labs and Tests ordered today are listed in the Patient Instructions below. Patient Instructions  Medication Instructions:  1) START COREG (carvedilol) 3.125 mg twice daily  Labwork: None  Testing/Procedures: None  Follow-Up: You have an appointment scheduled with Dr. Anne Fu 06/10/2017 at 11:40AM. Please arrive 15 minutes prior to your appointment.  Any Other Special Instructions Will Be Listed Below (If Applicable).     If you need a refill on your cardiac medications before your next appointment, please call your pharmacy.      Elson Clan, PA-C  03/19/2017 12:02 PM    [facility] [address] [place], [place], [place]  [address] Phone: [phone]; Fax: [phone]

## 2017-03-19 NOTE — Patient Instructions (Addendum)
Medication Instructions:  1) START COREG (carvedilol) 3.125 mg twice daily  Labwork: None  Testing/Procedures: None  Follow-Up: You have an appointment scheduled with Dr. Anne FuSkains 06/10/2017 at 11:40AM. Please arrive 15 minutes prior to your appointment.  Any Other Special Instructions Will Be Listed Below (If Applicable).     If you need a refill on your cardiac medications before your next appointment, please call your pharmacy.

## 2017-04-19 ENCOUNTER — Telehealth: Payer: Self-pay | Admitting: Cardiology

## 2017-04-19 NOTE — Telephone Encounter (Signed)
Spoke with patient who is reporting he has noticed an increase in bruising on his hand since changing from metoprolol to carvedilol.  Per his report the change was made in the attempt to better control his PVCs.  His RX states 1 PO BID however the patient states he was told he could increase it as needed for PVCs and he has been taking 3 tablets a day.  This has not improved his palpitations.  He is asking if he can just stop the carvedilol and I advised him he should not do so.  He believes the bruising on his hands is coming from the carvedilol and wants to change back to metoprolol.  Advised these orders would need to come from MD/app.  Also advised both the Turmeric and fish oil his is taking otc can cause increased bleeding times and would be more likely the reason for the bruising as opposed to the carvedilol.  Pt has an appt 2/13 with Dr Anne FuSkains and will keep the appt as scheduled to discuss.

## 2017-04-19 NOTE — Telephone Encounter (Signed)
Pt c/o medication issue:  1. Name of Medication: Carvedilol   2. How are you currently taking this medication (dosage and times per day)? 3 tablets daily   3. Are you having a reaction (difficulty breathing--STAT)?  No  4. What is your medication issue?getting small purple blotches on his hand .   Wants to know what else can he take or should he go back on the Metoprolol .Marland Kitchen. Please call

## 2017-04-22 ENCOUNTER — Telehealth: Payer: Self-pay | Admitting: Physician Assistant

## 2017-04-22 MED ORDER — CARVEDILOL 6.25 MG PO TABS: 6.2500 mg | ORAL_TABLET | Freq: Two times a day (BID) | ORAL | 3 refills | Status: DC

## 2017-04-22 NOTE — Telephone Encounter (Signed)
Spoke with pt and made him aware of Dr. Anne FuSkains recommendations.  Advised pt to keep appt for Wednesday as well.  Pt verbalized understanding and was appreciative for call.

## 2017-04-22 NOTE — Telephone Encounter (Signed)
OK to increase coreg to 6.25mg  PO BID.  Donato SchultzMark Skains, MD

## 2017-04-22 NOTE — Telephone Encounter (Signed)
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  States they use to occur about every 12 or so beats and over the last 24-48 hrs they have started to occur every 2 beats.  Pt feels slightly lightheaded on occasion.  Denies any diet changes or recent medication changes.  Pt denies any feelings of pre syncope.  Pt currently prescribed Carvedilol 3.125mg  BID but has been taking TID for about 2 weeks now.  Pt scheduled to see Dr. Anne FuSkains on Wednesday.  Pt concerned skipped beats being so close together.  Advised pt to keep appt on Wednesday and I will send message to Dr.Skains to see if he had further recommendations.  Advised if he did we would call back, otherwise we will see pt Wednesday.  Pt appreciative for call.

## 2017-04-22 NOTE — Telephone Encounter (Signed)
Patient c/o Palpitations:  High priority if patient c/o lightheadedness, shortness of breath, or chest pain  1) How long have you had palpitations/irregular HR/ Afib? Are you having the symptoms now?  Yes 40 beats a minutes   2) Are you currently experiencing lightheadedness, SOB or CP? no  3) Do you have a history of afib (atrial fibrillation) or irregular heart rhythm? yes  4) Have you checked your BP or HR? (document readings if available): 40 hr      120/78  5) Are you experiencing any other symptoms? lightheaded

## 2017-04-24 ENCOUNTER — Ambulatory Visit: Payer: Medicare Other | Admitting: Cardiology

## 2017-04-24 ENCOUNTER — Encounter: Payer: Self-pay | Admitting: Cardiology

## 2017-04-24 VITALS — BP 132/84 | HR 60 | Ht 68.0 in | Wt 171.4 lb

## 2017-04-24 DIAGNOSIS — R5383 Other fatigue: Secondary | ICD-10-CM

## 2017-04-24 DIAGNOSIS — R002 Palpitations: Secondary | ICD-10-CM | POA: Diagnosis not present

## 2017-04-24 DIAGNOSIS — I10 Essential (primary) hypertension: Secondary | ICD-10-CM | POA: Diagnosis not present

## 2017-04-24 MED ORDER — METOPROLOL TARTRATE 50 MG PO TABS: 50.0000 mg | ORAL_TABLET | Freq: Two times a day (BID) | ORAL | 3 refills | Status: DC

## 2017-04-24 NOTE — Patient Instructions (Signed)
Medication Instructions:  1) DISCONTINUE Carvedilol 2) In one week, START Metoprolol Tartrate 50mg  twice daily  Labwork: None  Testing/Procedures: None  Follow-Up: Your physician recommends that you schedule a follow-up appointment in: 1 month with Dr. Anne FuSkains.     Any Other Special Instructions Will Be Listed Below (If Applicable).     If you need a refill on your cardiac medications before your next appointment, please call your pharmacy.

## 2017-04-24 NOTE — Progress Notes (Signed)
Cardiology Office Note:    Date:  04/24/2017   ID:  Adam, Mckenzie 29-Sep-1945, MRN [id number]  PCP:  Marden Noble, MD  Cardiologist:  [name], MD   Referring MD: Marden Noble, MD     History of Present Illness:    Adam Mckenzie is a 72 y.o. male is here for follow up of palpitations at the request of Dr. Kevan Ny.   Prior visit: 07/24/16 - In review of prior notes, he has known premature ventricular contractions. He walked approximate 2 miles and did not have chest discomfort. He's been noticing more palpitations and his metoprolol does not seem to be helping. He is having increased fatigue as well. Dr. Kevan Ny was concerned about his fatigue and ordered a treadmill stress test as well as echocardiogram. His echocardiogram performed on 07/20/16 shows normal ejection fraction, grade 1 diastolic dysfunction, no other abnormalities. No significant change from prior echocardiogram. Reassuring.  His EKG personally viewed from 04/06/16 shows 3 PVCs, sinus rhythm heart rate 70.  He notes that he does not drink excessively, no significant caffeine use, one cup of coffee a day. He does not take any decongestants.  His palpitations were first noted a year ago.  04/24/17 -called in on 04/19/17 noticing some increased bruising on his hands since changing from metoprolol to carvedilol.  I suggested that he increase his carvedilol to 6.25 mg twice a day to see if this helps with his PVCs, palpitations.  He was taking extra carvedilol prior to this change.  He was once again advised that the tumeric and fish oil can lead to some bruising. Diltiazem bad reaction. No complaints at this visit about bruising.  Past Medical History:  Diagnosis Date  . Arthritis    osteoarthritis -hands  . GERD (gastroesophageal reflux disease)   . Hypertension   . Retinal detachment   . Tendonitis    left shoulder    Past Surgical History:  Procedure Laterality Date  . CATARACT EXTRACTION, BILATERAL    .  COLONOSCOPY WITH PROPOFOL N/A 06/06/2015   Procedure: COLONOSCOPY WITH PROPOFOL;  Surgeon: Charolett Bumpers, MD;  Location: [facility];  Service: Endoscopy;  Laterality: N/A;  . ESOPHAGOGASTRODUODENOSCOPY (EGD) WITH PROPOFOL N/A 06/06/2015   Procedure: ESOPHAGOGASTRODUODENOSCOPY (EGD) WITH PROPOFOL;  Surgeon: Charolett Bumpers, MD;  Location: [facility];  Service: Endoscopy;  Laterality: N/A;  . EUS N/A 07/13/2015   Procedure: LOWER ENDOSCOPIC ULTRASOUND (EUS);  Surgeon: Willis Modena, MD;  Location: [facility];  Service: Endoscopy;  Laterality: N/A;  . EYE SURGERY  09/09/2013   retinal detachment surgery  . left shoulder injection  10/07/13  . TONSILLECTOMY    . VASECTOMY      Current Medications: Current Meds  Medication Sig  . clonazePAM (KLONOPIN) 1 MG tablet Take 1 mg by mouth daily as needed for anxiety.  Marland Kitchen losartan-hydrochlorothiazide (HYZAAR) 100-12.5 MG per tablet Take 0.5 tablets by mouth every morning.   . Omega-3 Fatty Acids (FISH OIL) 1000 MG CAPS Take 1,000 mg by mouth daily.  Marland Kitchen omeprazole (PRILOSEC) 20 MG capsule Take 20 mg by mouth every morning.  . TURMERIC PO Take 1 capsule by mouth daily.     Allergies:   Diltiazem and Hydrocodone   Social History   Socioeconomic History  . Marital status: Married    Spouse name: None  . Number of children: None  . Years of education: None  . Highest education level: None  Social Needs  . Financial resource strain: None  .  Food insecurity - worry: None  . Food insecurity - inability: None  . Transportation needs - medical: None  . Transportation needs - non-medical: None  Occupational History  . None  Tobacco Use  . Smoking status: Never Smoker  . Smokeless tobacco: Never Used  . Tobacco comment: rare cigar use  Substance and Sexual Activity  . Alcohol use: Yes    Comment: 2-3 beers per day and glass of wine  . Drug use: No  . Sexual activity: None  Other Topics Concern  . None  Social History Narrative   Lives  with wife.  Moved from [place] 1 year ago due to financial strain and living in house that is in a trust for his brother who is in prison and being released later this year.  His mother lives in a house next door which is in a trust for him.  They are financially insecure.  His wife is teaching at [employer].  They lost a family friend a few weeks ago.  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       Family History: The patient's family history includes Alcoholism in his paternal uncle; Bipolar disorder in his brother; Depression in his mother; Heart disease in his father. There is no history of Stroke or Cancer. Father died at age 73 from CAD.  ROS:   Please see the history of present illness.    All other ROS negative.   EKGs/Labs/Other Studies Reviewed:    The following studies were reviewed today: Prior office notes, EKG, lab work, prior hospitalization reviewed  ECHO: 07/20/16 - Left ventricle: The cavity size was normal. Wall thickness was   normal. Systolic function was normal. The estimated ejection   fraction was in the range of 60% to 65%. Wall motion was normal;   there were no regional wall motion abnormalities. Doppler   parameters are consistent with abnormal left ventricular   relaxation (grade 1 diastolic dysfunction).  Impressions:  - Compared to the prior study, there has been no significant   interval change.  EKG:  EKG is  ordered today.  04/24/17 - NSR LAD. Personally viewed.  The ekg ordered today demonstrates 61 sinus rhythm with single PVC, left axis deviation, left atrial enlargement.  Recent Labs: No results found for requested labs within last 8760 hours.   Recent Lipid Panel    Component Value Date/Time   CHOL 251 (H) 10/09/2013 0254   TRIG 233 (H) 10/09/2013 0254   HDL 77 10/09/2013 0254   CHOLHDL 3.3 10/09/2013 0254   VLDL 47 (H) 10/09/2013 0254   LDLCALC 127 (H) 10/09/2013 0254    Physical Exam:    VS:  BP  132/84   Pulse 60   Ht 5\' 8"  (1.727 m)   Wt 171 lb 6.4 oz (77.7 kg)   BMI 26.06 kg/m     Wt Readings from Last 3 Encounters:  04/24/17 171 lb 6.4 oz (77.7 kg)  03/19/17 170 lb 12.8 oz (77.5 kg)  07/24/16 174 lb 2 oz (79 kg)     GEN:  Well nourished, well developed in no acute distress HEENT: Normal NECK: No JVD; No carotid bruits LYMPHATICS: No lymphadenopathy CARDIAC: RRR, no murmurs, rubs, gallops, Occasional ectopy RESPIRATORY:  Clear to auscultation without rales, wheezing or rhonchi  ABDOMEN: Soft, non-tender, non-distended MUSCULOSKELETAL:  No edema; No deformity  SKIN: Warm and dry NEUROLOGIC:  Alert and oriented x 3 PSYCHIATRIC:  Normal affect  ASSESSMENT:    1. Palpitations   2. Essential hypertension   3. Other fatigue    PLAN:    In order of problems listed above:  PVCs  -He is noticing a skipped beat every fourth or so at times.  This is causing some anxiety.  Ejection fraction normal.  We will try a different beta-blocker once again back to metoprolol but this time prescribed 50 of tartrate twice a day.  If his base heart rate becomes too slow, we will need to pull back on this dosage.  Previously he was on 25 mg.  I also suggested that after stopping the carvedilol, that he give himself a week of drug holiday, perhaps without any beta-blocker his base heart rate will be increased and therefore he will actually auto suppress his PVCs.  Let us see what happens.  No evidence of cardiomyopathy.  We discussed once again the possibility of flecanide or in rare instances ablative therapy in the future.   Fatigue/palpitations  -  In review of his prior medical history, in July 2015 he was hospitalized with transient global amnesia possible TIA versus narcotic effect. Hyponatremia was noted but resolved.  Overall doing well.  Hypertrigs  - 396, fish oil  1 month follow up.   Medication Adjustments/Labs and Tests Ordered: Current medicines are reviewed at length  with the patient today.  Concerns regarding medicines are outlined above. Labs and tests ordered and medication changes are outlined in the patient instructions below:  Patient Instructions  Medication Instructions:  1) DISCONTINUE Carvedilol 2) In one week, START Metoprolol Tartrate 50mg  twice daily  Labwork: None  Testing/Procedures: None  Follow-Up: Your physician recommends that you schedule a follow-up appointment in: 1 month with Dr. Anne FuSkains.     Any Other Special Instructions Will Be Listed Below (If Applicable).     If you need a refill on your cardiac medications before your next appointment, please call your pharmacy.      Signed, Donato SchultzMark Skains, MD  04/24/2017 12:14 PM    [facility]

## 2017-05-06 ENCOUNTER — Telehealth: Payer: Self-pay | Admitting: Cardiology

## 2017-05-06 NOTE — Telephone Encounter (Signed)
Pt c/o medication issue:  1. Name of Medication: metoprolol tartrate (LOPRESSOR) 50 MG tablet  2. How are you currently taking this medication (dosage and times per day)? Take 1 tablet (50 mg total) by mouth 2 (two) times daily. 3. Are you having a reaction (difficulty breathing--STAT)? no 4. What is your medication issue? skips heart beats, slowing pt down    Is it okay to not take medication until pt sees Dr.Skains in office?

## 2017-05-06 NOTE — Telephone Encounter (Signed)
Pt c/o increased slowing of heart rate since taking increased Metoprolol dose.  Reporting becoming dizzy-headed with position changes.  States he is going to stop taking Metoprolol all together.  Reports even when he takes the original dose of 25 mg BID he feels the same way.  Advised patient against stopping this medication cold [place] and that he would have to wean off of it.  He states understanding.  Will forward this information to Dr Anne FuSkains for his knowledge.  Pt has an appt scheduled in March and will f/u then.

## 2017-05-07 NOTE — Telephone Encounter (Signed)
Thanks for the update.  Agree with tapering off his metoprolol. Donato SchultzMark Nemesio Castrillon, MD

## 2017-05-09 ENCOUNTER — Other Ambulatory Visit: Payer: Self-pay | Admitting: Internal Medicine

## 2017-05-09 ENCOUNTER — Ambulatory Visit
Admission: RE | Admit: 2017-05-09 | Discharge: 2017-05-09 | Disposition: A | Discharge status: Home / Self Care | Payer: Medicare Other | Source: Ambulatory Visit | Attending: Internal Medicine | Admitting: Internal Medicine

## 2017-05-09 DIAGNOSIS — R200 Anesthesia of skin: Secondary | ICD-10-CM

## 2017-05-31 ENCOUNTER — Ambulatory Visit: Payer: Medicare Other | Admitting: Cardiology

## 2017-05-31 ENCOUNTER — Encounter: Payer: Self-pay | Admitting: Cardiology

## 2017-05-31 VITALS — BP 142/80 | HR 69 | Ht 68.5 in | Wt 172.0 lb

## 2017-05-31 DIAGNOSIS — R002 Palpitations: Secondary | ICD-10-CM

## 2017-05-31 MED ORDER — METOPROLOL SUCCINATE ER 25 MG PO TB24: 25.0000 mg | ORAL_TABLET | Freq: Two times a day (BID) | ORAL | 3 refills | Status: DC

## 2017-05-31 NOTE — Patient Instructions (Signed)

## 2017-05-31 NOTE — Progress Notes (Signed)
Cardiology Office Note:    Date:  05/31/2017   ID:  Talley, Kreiser [birth year], MRN [id number]  PCP:  Marden Noble, MD  Cardiologist:  [name], MD   Referring MD: Marden Noble, MD     History of Present Illness:    Adam Mckenzie is a 72 y.o. male is here for follow up of palpitations at the request of Dr. Kevan Ny.   Prior visit: 07/24/16 - In review of prior notes, he has known premature ventricular contractions. He walked approximate 2 miles and did not have chest discomfort. He's been noticing more palpitations and his metoprolol does not seem to be helping. He is having increased fatigue as well. Dr. Kevan Ny was concerned about his fatigue and ordered a treadmill stress test as well as echocardiogram. His echocardiogram performed on 07/20/16 shows normal ejection fraction, grade 1 diastolic dysfunction, no other abnormalities. No significant change from prior echocardiogram. Reassuring.  His EKG personally viewed from 04/06/16 shows 3 PVCs, sinus rhythm heart rate 70.  He notes that he does not drink excessively, no significant caffeine use, one cup of coffee a day. He does not take any decongestants.  His palpitations were first noted a year ago.  04/24/17 -called in on 04/19/17 noticing some increased bruising on his hands since changing from metoprolol to carvedilol.  I suggested that he increase his carvedilol to 6.25 mg twice a day to see if this helps with his PVCs, palpitations.  He was taking extra carvedilol prior to this change.  He was once again advised that the tumeric and fish oil can lead to some bruising. Diltiazem bad reaction. No complaints at this visit about bruising.  05/31/17 - tried metoprolol again 50 first then 25 and felt dizzy with position changes. He stopped after calling us 05/06/17. He is taking 25 BID.  No chest pain, no syncope, no bleeding.  He feels better  Past Medical History:  Diagnosis Date  . Arthritis    osteoarthritis -hands  . GERD  (gastroesophageal reflux disease)   . Hypertension   . Retinal detachment   . Tendonitis    left shoulder    Past Surgical History:  Procedure Laterality Date  . CATARACT EXTRACTION, BILATERAL    . COLONOSCOPY WITH PROPOFOL N/A 06/06/2015   Procedure: COLONOSCOPY WITH PROPOFOL;  Surgeon: Charolett Bumpers, MD;  Location: [facility];  Service: Endoscopy;  Laterality: N/A;  . ESOPHAGOGASTRODUODENOSCOPY (EGD) WITH PROPOFOL N/A 06/06/2015   Procedure: ESOPHAGOGASTRODUODENOSCOPY (EGD) WITH PROPOFOL;  Surgeon: Charolett Bumpers, MD;  Location: [facility];  Service: Endoscopy;  Laterality: N/A;  . EUS N/A 07/13/2015   Procedure: LOWER ENDOSCOPIC ULTRASOUND (EUS);  Surgeon: Willis Modena, MD;  Location: [facility];  Service: Endoscopy;  Laterality: N/A;  . EYE SURGERY  09/09/2013   retinal detachment surgery  . left shoulder injection  10/07/13  . TONSILLECTOMY    . VASECTOMY      Current Medications: Current Meds  Medication Sig  . clonazePAM (KLONOPIN) 1 MG tablet Take 1 mg by mouth daily as needed for anxiety.  Marland Kitchen losartan-hydrochlorothiazide (HYZAAR) 100-12.5 MG per tablet Take 0.5 tablets by mouth every morning.   . metoprolol succinate (TOPROL-XL) 25 MG 24 hr tablet Take 1 tablet (25 mg total) by mouth 2 (two) times daily.  . Omega-3 Fatty Acids (FISH OIL) 1000 MG CAPS Take 1,000 mg by mouth daily.  Marland Kitchen omeprazole (PRILOSEC) 20 MG capsule Take 20 mg by mouth every morning.  . TURMERIC PO Take 1  capsule by mouth daily.  . [DISCONTINUED] metoprolol succinate (TOPROL-XL) 25 MG 24 hr tablet Take 25 mg by mouth 2 (two) times daily.     Allergies:   Diltiazem and Hydrocodone   Social History   Socioeconomic History  . Marital status: Married    Spouse name: Not on file  . Number of children: Not on file  . Years of education: Not on file  . Highest education level: Not on file  Occupational History  . Not on file  Social Needs  . Financial resource strain: Not on file  . Food  insecurity:    Worry: Not on file    Inability: Not on file  . Transportation needs:    Medical: Not on file    Non-medical: Not on file  Tobacco Use  . Smoking status: Never Smoker  . Smokeless tobacco: Never Used  . Tobacco comment: rare cigar use  Substance and Sexual Activity  . Alcohol use: Yes    Comment: 2-3 beers per day and glass of wine  . Drug use: No  . Sexual activity: Not on file  Lifestyle  . Physical activity:    Days per week: Not on file    Minutes per session: Not on file  . Stress: Not on file  Relationships  . Social connections:    Talks on phone: Not on file    Gets together: Not on file    Attends religious service: Not on file    Active member of club or organization: Not on file    Attends meetings of clubs or organizations: Not on file    Relationship status: Not on file  Other Topics Concern  . Not on file  Social History Narrative   Lives with wife.  [place]oved [place] ago due to financial strain and living in house that is in a trust for his brother who is in prison and being released later this year.  His mother lives in a house next door which is in a trust for him.  They are financially insecure.  His wife is teaching at [employer].  They lost a family friend a few weeks ago.  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       Family History: The patient's family history includes Alcoholism in his paternal uncle; Bipolar disorder in his brother; Depression in his mother; Heart disease in his father. There is no history of Stroke or Cancer. Father died at age 70 from CAD.  ROS:   Please see the history of present illness.    All other ROS neg  EKGs/Labs/Other Studies Reviewed:    The following studies were reviewed today: Prior office notes, EKG, lab work, prior hospitalization reviewed  ECHO: 07/20/16 - Left ventricle: The cavity size was normal. Wall thickness was   normal. Systolic function was  normal. The estimated ejection   fraction was in the range of 60% to 65%. Wall motion was normal;   there were no regional wall motion abnormalities. Doppler   parameters are consistent with abnormal left ventricular   relaxation (grade 1 diastolic dysfunction).  Impressions:  - Compared to the prior study, there has been no significant   interval change.  EKG:   04/24/17 - NSR LAD. Personally viewed.  The ekg ordered today demonstrates 61 sinus rhythm with single PVC, left axis deviation, left atrial enlargement.  Recent Labs: No results found for requested labs within last 8760 hours.  Recent Lipid Panel    Component Value Date/Time   CHOL 251 (H) 10/09/2013 0254   TRIG 233 (H) 10/09/2013 0254   HDL 77 10/09/2013 0254   CHOLHDL 3.3 10/09/2013 0254   VLDL 47 (H) 10/09/2013 0254   LDLCALC 127 (H) 10/09/2013 0254    Physical Exam:    VS:  BP (!) 142/80   Pulse 69   Ht 5' 8.5" (1.74 m)   Wt 172 lb (78 kg)   SpO2 95%   BMI 25.77 kg/m     Wt Readings from Last 3 Encounters:  05/31/17 172 lb (78 kg)  04/24/17 171 lb 6.4 oz (77.7 kg)  03/19/17 170 lb 12.8 oz (77.5 kg)     GEN: Well nourished, well developed, in no acute distress  HEENT: normal  Neck: no JVD, carotid bruits, or masses Cardiac: RRR; no murmurs, rubs, or gallops,no edema (one ectopic beat) Respiratory:  clear to auscultation bilaterally, normal work of breathing GI: soft, nontender, nondistended, + BS MS: no deformity or atrophy  Skin: warm and dry, no rash Neuro:  Alert and Oriented x 3, Strength and sensation are intact Psych: euthymic mood, full affect   ASSESSMENT:    1. Palpitations    PLAN:    In order of problems listed above:  PVCs  -He is noticing a skipped beat every fourth or so at times.  This is causing some anxiety.  Ejection fraction normal.  He tried the metoprolol succinate 25 mg twice a day and this seemed to help out.  Originally I tried 67 but this was too strong for him  and he felt dizzy.  We will continue with the succinate 25 mg twice a day dosing.  I heard one on physical exam.  He did not feel it.   Fatigue/palpitations  -  In review of his prior medical history, in July 2015 he was hospitalized with transient global amnesia possible TIA versus narcotic effect. Hyponatremia was noted but resolved.  No changes, no new strokelike symptoms.  Hypertrigs  - 326, fish oil, continue.  Diet, exercise.  12 month follow up.   Medication Adjustments/Labs and Tests Ordered: Current medicines are reviewed at length with the patient today.  Concerns regarding medicines are outlined above. Labs and tests ordered and medication changes are outlined in the patient instructions below:  Patient Instructions  Medication Instructions:  The current medical regimen is effective;  continue present plan and medications.  Follow-Up: Follow up in 1 year with Dr. Anne Fu.  You will receive a letter in the mail 2 months before you are due.  Please call us when you receive this letter to schedule your follow up appointment.  If you need a refill on your cardiac medications before your next appointment, please call your pharmacy.  Thank you for choosing [facility]!!        Signed, [name], MD  05/31/2017 9:18 AM    [facility]

## 2017-09-10 ENCOUNTER — Other Ambulatory Visit: Payer: Self-pay | Admitting: Surgery

## 2017-09-10 DIAGNOSIS — K603 Anal fistula: Secondary | ICD-10-CM

## 2017-09-15 ENCOUNTER — Ambulatory Visit
Admission: RE | Admit: 2017-09-15 | Discharge: 2017-09-15 | Disposition: A | Discharge status: Home / Self Care | Payer: Medicare Other | Source: Ambulatory Visit | Attending: Surgery | Admitting: Surgery

## 2017-09-15 DIAGNOSIS — K603 Anal fistula: Secondary | ICD-10-CM

## 2017-09-15 MED ORDER — GADOBENATE DIMEGLUMINE 529 MG/ML IV SOLN
15.0000 mL | Freq: Once | INTRAVENOUS | Status: AC | PRN
  Administered 2017-09-15: 15 mL via INTRAVENOUS

## 2017-10-01 ENCOUNTER — Ambulatory Visit: Payer: Self-pay | Admitting: Surgery

## 2017-10-01 NOTE — H&P (Signed)
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 Luisa Hart  HPI: Mr. Stockham is a very pleasant 72yoM with hx of HTN, GERD who was seen by my partner, Dr. Luisa Hart for an anal weaned. 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 Per the operative note, the fistula involve the external sphincter or part of it as well as part of the internal sphincter a small part of it. A primary fistulotomy was performed. Following this he had a wound is fully granulated and has had a pinpoint size posterior midline wound that opens up every 3 months and has some bloody material drain since his procedure. He has been rating a textbook on wound care and has been applying a silver medicine cream to it. He has previously tried silver nitrate to the granulating area but the wound continues to open every 3 months. He denies any significant pain in that area. He denies any problems with incontinence to gas, liquid stool, or solid stool. He denies any purulent drainage from the wound.  He states he is up-to-date with his colonoscopies and had one 2 years ago and has never been told any polyps or other concerning findings. He denies any known history of inflammatory bowel disease. He denies any abdominal pain. He denies any fevers or chills.  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 No associated abscess.  Today, he also reports some issues with some gas and occasionally liquid stool incontinence. He is not aware about her diaper. He denies incontinence to solid stool. He describes his accidents as being a smear of stool in 5-6 hours after a bowel movement particularly with loose.  PMH: HTN, GERD  PSH: Fistulotomy 05/2016 with Dr.  Luisa Hart  FHx: Denies FHx of malignancy  Social: Denies use of tobacco/drugs; social EtOH use. Works in Education officer, environmental  ROS: A comprehensive 10 system review of systems was completed with the patient and pertinent findings as noted above.  The patient is a 72 year old male.   Allergies (Hadelyn Massenburg, LPN; 1/61/0960 45:40 AM) HYDROcodone Bitartrate *CHEMICALS*  Allergies Reconciled   Medication History (Hadelyn Massenburg, LPN; 9/81/1914 78:29 AM) PrednisoLONE Acetate (1% Suspension, Ophthalmic) Active. ClonazePAM (1MG  Tablet, Oral) Active. Metoprolol Succinate ER (25MG  Tablet ER 24HR, Oral) Active. Losartan Potassium (100MG  Tablet, Oral) Active. Losartan Potassium-HCTZ (100-12.5MG  Tablet, Oral daily) Active. PriLOSEC (20MG  Capsule DR, Oral daily) Active. Medications Reconciled  Vitals (Hadelyn Massenburg LPN; 5/62/1308 65:78 AM) 10/01/2017 10:45 AM Weight: 166.38 lb Height: 68in Body Surface Area: 1.89 m Body Mass Index: 25.3 kg/m  Temp.: 68F  Pulse: 67 (Regular)  BP: 120/82 (Sitting, Left Arm, Standard)       Physical Exam Cristal Deer M. Fredrica Capano MD; 10/01/2017 11:14 AM) The physical exam findings are as follows: Note:Constitutional: No acute distress; conversant; no deformities Eyes: Moist conjunctiva; no lid lag; anicteric sclerae; pupils equal round and reactive to light Neck: Trachea midline; no palpable thyromegaly Lungs: Normal respiratory effort; no tactile fremitus CV: Regular rate and rhythm; no palpable thrill; no pitting edema GI: Abdomen soft, nontender, nondistended; no palpable hepatosplenomegaly Anorectal: Punctate -sized area in the posterior midline consistent with external opening. Firmness/scarring consistent with prior fistulotomy. No erythema. No fluctuance. No tenderness. DRE/anoscopy deferred MSK: Normal gait; no clubbing/cyanosis Psychiatric: Appropriate affect; alert and oriented  3 Lymphatic: No palpable cervical or axillary  lymphadenopathy    Assessment & Plan Cristal Deer(Dillion Stowers M. 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 The pathophysiology of anal abscess/fistula was discussed at length with associated pictures and illustrations. -11 physiology of the GI tract and anal canal was again discussed today. The pathophysiology of anal abscess and fistula discussed. We discussed the results of his MRI. He has had issues with ongoing although intermittent drainage from this fistula. We discussed options moving forward including further observation, draining seton, redo fistulotomy, and even fibrin glue. He is quite concerned about any worsening to his baseline incontinence and would like to avoid any procedures that could make this worse. We discussed the relative low success of fibril glue injections but also the low risk of any worsening of incontinence associated with this procedure. He has opted to give this a try. We discussed at least a 70-80% chance of recurrence following this. -The planned procedure, material risks (including, but not limited to, pain, bleeding, infection, scarring, damage to anal sphincter, incontinence of gas and/or stool, need for additional procedures, recurrence, pneumonia, heart attack, stroke, death) benefits and alternatives to surgery were discussed at length. The patient's questions were answered to his satisfaction, he voiced understanding and elected to proceed with surgery. Additionally, we discussed typical postoperative expectations and the recovery process.  Signed electronically by Andria Meusehristopher M Alyda Megna, MD (10/01/2017 11:16 AM)

## 2017-10-08 ENCOUNTER — Encounter (HOSPITAL_BASED_OUTPATIENT_CLINIC_OR_DEPARTMENT_OTHER): Payer: Self-pay | Admitting: *Deleted

## 2017-10-08 ENCOUNTER — Other Ambulatory Visit: Payer: Self-pay

## 2017-10-08 NOTE — Progress Notes (Signed)
Spoke with patient via telephone for pre-op interview. NPO after MN. To take toprol and prilosec am of surgery day. Needs Istat and EKG.

## 2017-10-14 ENCOUNTER — Ambulatory Visit (HOSPITAL_BASED_OUTPATIENT_CLINIC_OR_DEPARTMENT_OTHER): Payer: Medicare Other | Admitting: Anesthesiology

## 2017-10-14 ENCOUNTER — Ambulatory Visit (HOSPITAL_BASED_OUTPATIENT_CLINIC_OR_DEPARTMENT_OTHER)
Admission: RE | Admit: 2017-10-14 | Discharge: 2017-10-14 | Disposition: A | Discharge status: Home / Self Care | Payer: Medicare Other | Source: Ambulatory Visit | Attending: Surgery | Admitting: Surgery

## 2017-10-14 ENCOUNTER — Other Ambulatory Visit: Payer: Self-pay

## 2017-10-14 ENCOUNTER — Encounter (HOSPITAL_BASED_OUTPATIENT_CLINIC_OR_DEPARTMENT_OTHER)
Admission: RE | Disposition: A | Discharge status: Home / Self Care | Payer: Self-pay | Source: Ambulatory Visit | Attending: Surgery

## 2017-10-14 ENCOUNTER — Encounter (HOSPITAL_BASED_OUTPATIENT_CLINIC_OR_DEPARTMENT_OTHER): Payer: Self-pay

## 2017-10-14 DIAGNOSIS — F17200 Nicotine dependence, unspecified, uncomplicated: Secondary | ICD-10-CM | POA: Diagnosis not present

## 2017-10-14 DIAGNOSIS — I10 Essential (primary) hypertension: Secondary | ICD-10-CM | POA: Diagnosis not present

## 2017-10-14 DIAGNOSIS — K219 Gastro-esophageal reflux disease without esophagitis: Secondary | ICD-10-CM | POA: Diagnosis not present

## 2017-10-14 DIAGNOSIS — M1990 Unspecified osteoarthritis, unspecified site: Secondary | ICD-10-CM | POA: Insufficient documentation

## 2017-10-14 DIAGNOSIS — Z8673 Personal history of transient ischemic attack (TIA), and cerebral infarction without residual deficits: Secondary | ICD-10-CM | POA: Diagnosis not present

## 2017-10-14 DIAGNOSIS — K603 Anal fistula: Secondary | ICD-10-CM | POA: Insufficient documentation

## 2017-10-14 HISTORY — PX: INCISION AND DRAINAGE ABSCESS: SHX5864

## 2017-10-14 LAB — POCT I-STAT, CHEM 8
BUN: 13 mg/dL (ref 8–23)
CALCIUM ION: 1.18 mmol/L (ref 1.15–1.40)
CREATININE: 1 mg/dL (ref 0.61–1.24)
Chloride: 102 mmol/L (ref 98–111)
Glucose, Bld: 84 mg/dL (ref 70–99)
HEMATOCRIT: 41 % (ref 39.0–52.0)
HEMOGLOBIN: 13.9 g/dL (ref 13.0–17.0)
Potassium: 4.2 mmol/L (ref 3.5–5.1)
SODIUM: 138 mmol/L (ref 135–145)
TCO2: 27 mmol/L (ref 22–32)

## 2017-10-14 SURGERY — EXAM UNDER ANESTHESIA
Anesthesia: Monitor Anesthesia Care | Site: Rectum

## 2017-10-14 MED ORDER — PROPOFOL 500 MG/50ML IV EMUL
INTRAVENOUS | Status: AC
  Filled 2017-10-14: qty 50

## 2017-10-14 MED ORDER — EVICEL 5 ML EX KIT
PACK | CUTANEOUS | Status: DC | PRN
  Administered 2017-10-14: 5 mL

## 2017-10-14 MED ORDER — LIDOCAINE 2% (20 MG/ML) 5 ML SYRINGE
INTRAMUSCULAR | Status: DC | PRN
  Administered 2017-10-14: 60 mg via INTRAVENOUS

## 2017-10-14 MED ORDER — OXYCODONE HCL 5 MG/5ML PO SOLN
5.0000 mg | Freq: Once | ORAL | Status: DC | PRN
  Filled 2017-10-14: qty 5

## 2017-10-14 MED ORDER — ONDANSETRON HCL 4 MG/2ML IJ SOLN
INTRAMUSCULAR | Status: DC | PRN
  Administered 2017-10-14: 4 mg via INTRAVENOUS

## 2017-10-14 MED ORDER — SODIUM CHLORIDE 0.9 % IR SOLN
Status: DC | PRN
  Administered 2017-10-14: 1000 mL

## 2017-10-14 MED ORDER — BUPIVACAINE LIPOSOME 1.3 % IJ SUSP
INTRAMUSCULAR | Status: DC | PRN
  Administered 2017-10-14: 20 mL

## 2017-10-14 MED ORDER — LACTATED RINGERS IV SOLN
INTRAVENOUS | Status: DC
  Administered 2017-10-14 (×2): via INTRAVENOUS
  Filled 2017-10-14: qty 1000

## 2017-10-14 MED ORDER — CHLORHEXIDINE GLUCONATE CLOTH 2 % EX PADS
6.0000 | MEDICATED_PAD | Freq: Once | CUTANEOUS | Status: DC
  Filled 2017-10-14: qty 6

## 2017-10-14 MED ORDER — FENTANYL CITRATE (PF) 100 MCG/2ML IJ SOLN
INTRAMUSCULAR | Status: DC | PRN
  Administered 2017-10-14: 25 ug via INTRAVENOUS
  Administered 2017-10-14: 50 ug via INTRAVENOUS

## 2017-10-14 MED ORDER — EVICEL 5 ML EX KIT
PACK | Freq: Once | CUTANEOUS | Status: DC
  Filled 2017-10-14 (×2): qty 1

## 2017-10-14 MED ORDER — BUPIVACAINE LIPOSOME 1.3 % IJ SUSP
20.0000 mL | Freq: Once | INTRAMUSCULAR | Status: DC
  Filled 2017-10-14: qty 20

## 2017-10-14 MED ORDER — HYDROMORPHONE HCL 1 MG/ML IJ SOLN
0.2500 mg | INTRAMUSCULAR | Status: DC | PRN
  Filled 2017-10-14: qty 0.5

## 2017-10-14 MED ORDER — PROPOFOL 500 MG/50ML IV EMUL
INTRAVENOUS | Status: DC | PRN
  Administered 2017-10-14: 150 ug/kg/min via INTRAVENOUS

## 2017-10-14 MED ORDER — SODIUM CHLORIDE 0.9 % IV SOLN
INTRAVENOUS | Status: AC
  Filled 2017-10-14: qty 2

## 2017-10-14 MED ORDER — PROMETHAZINE HCL 25 MG/ML IJ SOLN
6.2500 mg | INTRAMUSCULAR | Status: DC | PRN
  Filled 2017-10-14: qty 1

## 2017-10-14 MED ORDER — SODIUM CHLORIDE 0.9 % IV SOLN
2.0000 g | INTRAVENOUS | Status: DC
  Filled 2017-10-14: qty 2

## 2017-10-14 MED ORDER — BUPIVACAINE-EPINEPHRINE 0.25% -1:200000 IJ SOLN
INTRAMUSCULAR | Status: DC | PRN
  Administered 2017-10-14: 30 mL

## 2017-10-14 MED ORDER — PROPOFOL 10 MG/ML IV BOLUS
INTRAVENOUS | Status: AC
  Filled 2017-10-14: qty 20

## 2017-10-14 MED ORDER — PROPOFOL 10 MG/ML IV BOLUS
INTRAVENOUS | Status: DC | PRN
  Administered 2017-10-14 (×5): 20 mg via INTRAVENOUS

## 2017-10-14 MED ORDER — OXYCODONE HCL 5 MG PO TABS
5.0000 mg | ORAL_TABLET | Freq: Once | ORAL | Status: DC | PRN
  Filled 2017-10-14: qty 1

## 2017-10-14 MED ORDER — MIDAZOLAM HCL 2 MG/2ML IJ SOLN
INTRAMUSCULAR | Status: DC | PRN
  Administered 2017-10-14: 1 mg via INTRAVENOUS

## 2017-10-14 MED ORDER — LIDOCAINE 2% (20 MG/ML) 5 ML SYRINGE
INTRAMUSCULAR | Status: AC
  Filled 2017-10-14: qty 5

## 2017-10-14 MED ORDER — ONDANSETRON HCL 4 MG/2ML IJ SOLN
INTRAMUSCULAR | Status: AC
  Filled 2017-10-14: qty 2

## 2017-10-14 MED ORDER — MIDAZOLAM HCL 2 MG/2ML IJ SOLN
INTRAMUSCULAR | Status: AC
  Filled 2017-10-14: qty 2

## 2017-10-14 MED ORDER — TRAMADOL HCL 50 MG PO TABS: 50.0000 mg | ORAL_TABLET | Freq: Four times a day (QID) | ORAL | 0 refills | Status: AC | PRN

## 2017-10-14 MED ORDER — FENTANYL CITRATE (PF) 100 MCG/2ML IJ SOLN
INTRAMUSCULAR | Status: AC
  Filled 2017-10-14: qty 2

## 2017-10-14 SURGICAL SUPPLY — 55 items
BENZOIN TINCTURE PRP APPL 2/3 (GAUZE/BANDAGES/DRESSINGS) ×6 IMPLANT
BLADE EXTENDED COATED 6.5IN (ELECTRODE) IMPLANT
BLADE HEX COATED 2.75 (ELECTRODE) ×3 IMPLANT
BLADE SURG 10 STRL SS (BLADE) IMPLANT
BLADE SURG 15 STRL LF DISP TIS (BLADE) IMPLANT
BLADE SURG 15 STRL SS (BLADE)
BRIEF STRETCH FOR OB PAD LRG (UNDERPADS AND DIAPERS) ×6 IMPLANT
CANISTER SUCT 3000ML PPV (MISCELLANEOUS) ×3 IMPLANT
COVER BACK TABLE 60X90IN (DRAPES) ×3 IMPLANT
COVER MAYO STAND STRL (DRAPES) ×3 IMPLANT
DECANTER SPIKE VIAL GLASS SM (MISCELLANEOUS) IMPLANT
DRAPE LAPAROTOMY 100X72 PEDS (DRAPES) ×3 IMPLANT
DRAPE UTILITY XL STRL (DRAPES) ×3 IMPLANT
GAUZE SPONGE 4X4 12PLY STRL LF (GAUZE/BANDAGES/DRESSINGS) ×3 IMPLANT
GAUZE SPONGE 4X4 16PLY XRAY LF (GAUZE/BANDAGES/DRESSINGS) IMPLANT
GLOVE BIO SURGEON STRL SZ 6.5 (GLOVE) ×2 IMPLANT
GLOVE BIO SURGEON STRL SZ7.5 (GLOVE) ×3 IMPLANT
GLOVE BIO SURGEONS STRL SZ 6.5 (GLOVE) ×1
GLOVE BIOGEL PI IND STRL 6.5 (GLOVE) ×2 IMPLANT
GLOVE BIOGEL PI IND STRL 7.5 (GLOVE) ×1 IMPLANT
GLOVE BIOGEL PI INDICATOR 6.5 (GLOVE) ×4
GLOVE BIOGEL PI INDICATOR 7.5 (GLOVE) ×2
GLOVE INDICATOR 8.0 STRL GRN (GLOVE) ×3 IMPLANT
GOWN STRL REUS W/TWL LRG LVL3 (GOWN DISPOSABLE) ×6 IMPLANT
HYDROGEN PEROXIDE 16OZ (MISCELLANEOUS) ×3 IMPLANT
KIT TURNOVER CYSTO (KITS) ×3 IMPLANT
LOOP VESSEL MAXI BLUE (MISCELLANEOUS) IMPLANT
NDL SAFETY ECLIPSE 18X1.5 (NEEDLE) IMPLANT
NEEDLE HYPO 18GX1.5 SHARP (NEEDLE)
NEEDLE HYPO 22GX1.5 SAFETY (NEEDLE) ×3 IMPLANT
NS IRRIG 500ML POUR BTL (IV SOLUTION) ×6 IMPLANT
PACK BASIN DAY SURGERY FS (CUSTOM PROCEDURE TRAY) ×3 IMPLANT
PAD ABD 8X10 STRL (GAUZE/BANDAGES/DRESSINGS) ×3 IMPLANT
PENCIL BUTTON HOLSTER BLD 10FT (ELECTRODE) ×3 IMPLANT
SPONGE HEMORRHOID 8X3CM (HEMOSTASIS) IMPLANT
SPONGE SURGIFOAM ABS GEL 12-7 (HEMOSTASIS) IMPLANT
SUCTION FRAZIER HANDLE 10FR (MISCELLANEOUS)
SUCTION TUBE FRAZIER 10FR DISP (MISCELLANEOUS) IMPLANT
SUT CHROMIC 2 0 SH (SUTURE) IMPLANT
SUT CHROMIC 3 0 SH 27 (SUTURE) IMPLANT
SUT ETHIBOND 0 (SUTURE) IMPLANT
SUT MNCRL AB 4-0 PS2 18 (SUTURE) IMPLANT
SUT SILK 2 0 (SUTURE)
SUT SILK 2-0 18XBRD TIE 12 (SUTURE) IMPLANT
SUT VIC AB 2-0 SH 27 (SUTURE)
SUT VIC AB 2-0 SH 27XBRD (SUTURE) IMPLANT
SUT VIC AB 3-0 SH 18 (SUTURE) IMPLANT
SUT VIC AB 4-0 P-3 18XBRD (SUTURE) IMPLANT
SUT VIC AB 4-0 P3 18 (SUTURE)
SYR CONTROL 10ML LL (SYRINGE) ×3 IMPLANT
TOWEL OR 17X24 6PK STRL BLUE (TOWEL DISPOSABLE) ×6 IMPLANT
TRAY DSU PREP LF (CUSTOM PROCEDURE TRAY) ×3 IMPLANT
TUBE CONNECTING 12'X1/4 (SUCTIONS) ×1
TUBE CONNECTING 12X1/4 (SUCTIONS) ×2 IMPLANT
YANKAUER SUCT BULB TIP NO VENT (SUCTIONS) ×3 IMPLANT

## 2017-10-14 NOTE — Anesthesia Postprocedure Evaluation (Signed)
Anesthesia Post Note  Patient: Adam Mckenzie  Procedure(s) Performed: ANAL EXAM UNDER ANESTHESIA (N/A Rectum) POSSIBLE ANAL ABSCESS INCISION AND DRAINAGE, INJECTION OF FIBRIN GLUE INTO FISTULA TRACT (N/A Rectum)     Patient location during evaluation: PACU Anesthesia Type: MAC Level of consciousness: awake and alert Pain management: pain level controlled Vital Signs Assessment: post-procedure vital signs reviewed and stable Respiratory status: spontaneous breathing, nonlabored ventilation and respiratory function stable Cardiovascular status: stable and blood pressure returned to baseline Postop Assessment: no apparent nausea or vomiting Anesthetic complications: no    Last Vitals:  Vitals:   10/14/17 0915 10/14/17 1007  BP: 131/72 133/62  Pulse: (!) 52 (!) 59  Resp: 11 16  Temp:  36.7 C  SpO2: 99% 99%    Last Pain:  Vitals:   10/14/17 1007  TempSrc: Oral  PainSc: 0-No pain                 Lynda Rainwater

## 2017-10-14 NOTE — Discharge Instructions (Addendum)
ANORECTAL SURGERY: POST OP INSTRUCTIONS  1. DIET: Follow a light bland diet the first 24 hours after arrival home, such as soup, liquids, crackers, etc.  Be sure to include lots of fluids daily.  Avoid fast food or heavy meals as your are more likely to get nauseated.  Eat a low fat diet the next few days after surgery.    2. Take your usually prescribed home medications unless otherwise directed.  3. PAIN CONTROL: a. It is helpful to take an over-the-counter pain medication regularly for the first few days/weeks.  Choose from the following that works best for you: i. Ibuprofen (Advil, etc) Three 200mg  tabs every 6 hours as needed. ii. Acetaminophen (Tylenol, etc) 500-650mg  every 6 hours as needed iii. NOTE: You may take both of these medications together - most patients find it most helpful when alternating between the two (i.e. Ibuprofen at 6am, tylenol at 9am, ibuprofen at 12pm ...) b. A  prescription for pain medication may have been prescribed for you at discharge.  Take your pain medication as prescribed.  i. If you are having problems/concerns with the prescription medicine, please call us for further advice.  4. Avoid getting constipated.  Between the surgery and the pain medications, it is common to experience some constipation.  Increasing fluid intake (64oz of water per day) and taking a fiber supplement (such as Metamucil, Citrucel, FiberCon) 1-2 times a day regularly will usually help prevent this problem from occurring.  Take Miralax (over the counter) 1-2x/day while taking a narcotic pain medication. If no bowel movement after 48hours, you may additionally take a laxative like a bottle of Milk of Magnesia which can be purchased over the counter. Avoid enemas if possible as these are often painful.   5. Watch out for diarrhea.  If you have many loose bowel movements, simplify your diet to bland foods.  Stop any stool softeners and decrease your fiber supplement. If this worsens or does  not improve, please call us.  6. Wash / shower every day.  If you were discharged with a dressing, you may remove this the day after your surgery. You may shower normally, getting soap/water on your wound, particularly after bowel movements.  7. Soaking in a warm bath filled a couple inches ("Sitz bath") is a great way to clean the area after a bowel movement and many patients find it is a way to soothe the area.  8. ACTIVITIES as tolerated:   a. You may resume regular (light) daily activities beginning the next day--such as daily self-care, walking, climbing stairs--gradually increasing activities as tolerated.  If you can walk 30 minutes without difficulty, it is safe to try more intense activity such as jogging, treadmill, bicycling, low-impact aerobics, etc. b. Refrain from any heavy lifting or straining for the first 2 weeks after your procedure, particularly if your surgery was for hemorrhoids. c. Avoid activities that make your pain worse d. You may drive when you are no longer taking prescription pain medication, you can comfortably wear a seatbelt, and you can safely maneuver your car and apply brakes.  9. FOLLOW UP in our office a. Please call [facility] at [phone] to set up an appointment to see your surgeon in the office for a follow-up appointment approximately 4-6 weeks after your surgery. b. Make sure that you call for this appointment the day you arrive home to insure a convenient appointment time.  9. If you have disability or family leave forms that need to be completed,  you may have them completed by your primary care physician's office; for return to work instructions, please ask our office staff and they will be happy to assist you in obtaining this documentation   When to call us [phone]: 1. Poor pain control 2. Reactions / problems with new medications (rash/itching, etc)  3. Fever over 101.5 F (38.5 C) 4. Inability to urinate 5. Nausea/vomiting 6. Worsening  swelling or bruising 7. Continued bleeding from incision. 8. Increased pain, redness, or drainage from the incision  The clinic staff is available to answer your questions during regular business hours (8:30am-5pm).  Please dont hesitate to call and ask to speak to one of our nurses for clinical concerns.   A surgeon from [facility] is always on call at the hospitals   If you have a medical emergency, go to the nearest emergency room or call 911.   [facility] [address], [address], [place], [place]  [id number] ? MAIN: [phone] FAX [phone] [email or web address]    Post Anesthesia Home Care Instructions  Activity: Get plenty of rest for the remainder of the day. A responsible individual must stay with you for 24 hours following the procedure.  For the next 24 hours, DO NOT: -Drive a car -Advertising copywriterperate machinery -Drink alcoholic beverages -Take any medication unless instructed by your physician -Make any legal decisions or sign important papers.  Meals: Start with liquid foods such as gelatin or soup. Progress to regular foods as tolerated. Avoid greasy, spicy, heavy foods. If nausea and/or vomiting occur, drink only clear liquids until the nausea and/or vomiting subsides. Call your physician if vomiting continues.  Special Instructions/Symptoms: Your throat may feel dry or sore from the anesthesia or the breathing tube placed in your throat during surgery. If this causes discomfort, gargle with warm salt water. The discomfort should disappear within 24 hours.

## 2017-10-14 NOTE — Transfer of Care (Signed)
Last Vitals:  Vitals Value Taken Time  BP 119/62 10/14/2017  8:45 AM  Temp 36.9 C 10/14/2017  8:43 AM  Pulse 58 10/14/2017  8:50 AM  Resp 17 10/14/2017  8:50 AM  SpO2 98 % 10/14/2017  8:50 AM  Vitals shown include unvalidated device data.  Last Pain:  Vitals:   10/14/17 0843  TempSrc:   PainSc: 0-No pain      Patients Stated Pain Goal: 5 (10/14/17 0617)  Immediate Anesthesia Transfer of Care Note  Patient: [name]  Procedure(s) Performed: Procedure(s) (LRB): ANAL EXAM UNDER ANESTHESIA (N/A) POSSIBLE ANAL ABSCESS INCISION AND DRAINAGE, INJECTION OF FIBRIN GLUE INTO FISTULA TRACT (N/A)  Patient Location: PACU  Anesthesia Type:MAC  Level of Consciousness: awake, alert  and oriented  Airway & Oxygen Therapy: Patient Spontanous Breathing and Patient connected to face mask oxygen  Post-op Assessment: Report given to PACU RN and Post -op Vital signs reviewed and stable  Post vital signs: Reviewed and stable  Complications: No apparent anesthesia complications

## 2017-10-14 NOTE — Anesthesia Preprocedure Evaluation (Signed)
Anesthesia Evaluation  Patient identified by MRN, date of birth, ID band Patient awake    Reviewed: Allergy & Precautions, NPO status , Patient's Chart, lab work & pertinent test results, reviewed documented beta blocker date and time   Airway Mallampati: II   Neck ROM: Full    Dental  (+) Dental Advisory Given, Teeth Intact   Pulmonary neg pulmonary ROS, Current Smoker,    breath sounds clear to auscultation       Cardiovascular hypertension, Pt. on medications and Pt. 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 negative neurological ROS  negative psych ROS   GI/Hepatic negative GI ROS, Neg liver ROS, GERD  ,  Endo/Other  negative endocrine ROS  Renal/GU negative Renal ROS  negative genitourinary   Musculoskeletal negative musculoskeletal ROS (+) Arthritis , Osteoarthritis,    Abdominal   Peds negative pediatric ROS (+)  Hematology negative hematology ROS (+)   Anesthesia Other Findings   Reproductive/Obstetrics negative OB ROS                             Anesthesia Physical  Anesthesia Plan  ASA: II  Anesthesia Plan: MAC   Post-op Pain Management:    Induction: Intravenous  PONV Risk Score and Plan: 1 and Ondansetron  Airway Management Planned: Simple Face Mask  Additional Equipment:   Intra-op Plan:   Post-operative Plan:   Informed Consent: I have reviewed the patients History and Physical, chart, labs and discussed the procedure including the risks, benefits and alternatives for the proposed anesthesia with the patient or authorized representative who has indicated his/her understanding and acceptance.     Plan Discussed with:   Anesthesia Plan Comments:         Anesthesia Quick Evaluation

## 2017-10-14 NOTE — H&P (Signed)
INTERVAL H&P  H&P from 10/01/17 was reviewed by myself with the patient. He reports no interval changes to his history. He has no complaints today.   Vitals:   10/08/17 0920 10/14/17 0541 10/14/17 0617  BP:  (!) 150/73   Pulse:  (!) 56   Resp:  16   Temp:  97.9 F (36.6 C)   TempSrc:  Oral   SpO2:  99%   Weight: 74.8 kg (165 lb) 73.9 kg (163 lb)   Height: 5\' 8"  (1.727 m)  5' 8.5" (1.74 m)   Gen: NAD, comfortable CV: RRR GI: Abd soft, NT/ND Ext: No pitting edema  A/P Adam Mckenzie is a very pleasant 72yoM with hx of HTN, GERD here today surgery for a recurrent/persistent anal fistula - posterior midline - baseline issues with some gas and occasionally liquid stool seepage  -The anatomy and physiology of the anal canal was again discussed at length with the patient. The pathophysiology of anal abscess/fistula was discussed as well. -The anatomy and physiology of the GI tract and anal canal was again discussed today. The pathophysiology of anal abscess and fistula discussed. We discussed the results of his MRI. He has had issues with ongoing although intermittent drainage from this fistula. We discussed options moving forward including further observation, draining seton, cutting seton, redo fistulotomy, and fibrin glue. He is quite concerned about any worsening to his baseline incontinence and would like to avoid any procedures that could make this worse for the time being. He opted to pursue fibrin glue. We discussed the relative low success of fibrin glue injections but also the low risk of any worsening of incontinence associated with this procedure. He has opted to give this a try. We discussed at least a 70-80% chance of recurrence following this. -The planned procedure, material risks (including, but not limited to, pain, bleeding, infection, scarring, damage to anal sphincter, incontinence of gas and/or stool, need for additional procedures, recurrence, pneumonia, heart attack, stroke,  death) benefits and alternatives to surgery were discussed at length. The patient's questions were answered to his satisfaction, he voiced understanding and elected to proceed with surgery. Additionally, we discussed typical postoperative expectations and the recovery process.  Adam Mckenzie, M.D. General and Colorectal Surgery [facility]

## 2017-10-14 NOTE — Op Note (Signed)
10/14/2017  8:35 AM  PATIENT:  Adam Mckenzie  72 y.o. male  Patient Care Team: Josetta Huddle, MD as PCP - General (Internal Medicine)  PRE-OPERATIVE DIAGNOSIS:  ANAL FISTULA  POST-OPERATIVE DIAGNOSIS:  ANAL FISTULA  PROCEDURE:   1. Anorectal exam under anesthesia 2. Injection of perianal fistulous/sinus tract with fibrin/thrombin glue  SURGEON:  Surgeon(s): Ileana Roup, MD  ASSISTANT: Carlena Hurl, PA-C   ANESTHESIA:   MAC  SPECIMEN:  No Specimen  DISPOSITION OF SPECIMEN:  N/A  COUNTS:  Sponge, needle and instrument counts were reported correct x2  PLAN OF CARE: Discharge to home after PACU  PATIENT DISPOSITION:  PACU - hemodynamically stable.  INDICATION: Adam Mckenzie is a very pleasant 61yoM with hx of HTN, GERD whom was seen by me in the office for possible recurrent perianal fistula. He underwent EUA/fistulotomy by my partner, Dr. Brantley Stage, 05/2016. Per the operative note, the fistula involve the external sphincter or part of it as well as part of the internal sphincter a small part of it. A primary fistulotomy was performed. Following this he had a wound is fully granulated and has had a pinpoint size posterior midline wound that opens up every 3 months and has some bloody material drain since his procedure. He has been rating a textbook on wound care and has been applying a silver medicine cream to it. He has previously tried silver nitrate to the granulating area but the wound continues to open every 3 months. He denies any significant pain in that area. He denies any problems with incontinence to gas, liquid stool, or solid stool. He denies any purulent drainage from the wound.  He stated he is up-to-date with his colonoscopies and had one 2 years ago and has never been told any polyps or other concerning findings. He denies any known history of inflammatory bowel disease. He denies any abdominal pain. He denies any fevers or chills.  He underwent MRI  pelvis - this demonstrated an intersphincteric perianal fistula arising in the posterior midline and tracking caudally to the intergluteal fold. No associated abscess.  He reported some issues with some gas and occasionally liquid stool incontinence. He does not wear a diaper. He denies incontinence to solid stool. He describes his accidents as being a smear of stool in 5-6 hours after a bowel movement particularly with loose.   On exam, he did have a pin sized area in posterior midline with an opening consistent with an external opening.  Given his symptoms, exam and MRI findings, EUA was discussed. Options moving forward were discussed and he opted to pursue injection of the fistulous tract with fibrin glue given baseline issues with incontinence. Please refer to H&P for details regarding this discussion.  OR FINDINGS:  Thinning of sphincter in posterior midline but external sphincter appears largely intact. Scarring consistent with prior fistulotomy. Posterior midline external opening with granulation tissue. Tract carefully probed and no internal opening identified. Tract injected with peroxide and no internal drainage noted. Tract did extend ~2cm into the anal sphincter apparatus but no clear internal opening. This was then injected with Evicel (thrombin+fibrin).  DESCRIPTION: The patient was identified in the preoperative holding area and taken to the OR where he was placed on the operating room table in the prone position. SCDs were placed.  MAC anesthesia was induced without difficulty. The patient was then positioned in jackknife position with buttocks gently taped apart.  The patient was then prepped and draped in usual sterile fashion.  A  surgical timeout was performed indicating the correct patient, procedure, positioning and need for preoperative antibiotics.  A rectal block was performed using Marcaine with epinephrine and Exparel.    I began with a digital rectal exam.  Thinning of  the posterior sphincter was noted. Scarring as noted above. External sphincter feels to be intact.  I then placed a Hill-Ferguson anoscope into the anal canal and evaluated this completely.  No internal opening readily identified; thinning of internal sphincter noted in posterior midline. A fistula probe was inserted into the fistulous tract and gently probed. No internal opening was identifiable despite multiple passes of the probe. The tract was then injected with H2O2 and no ubbling was noted inside the anal canal. The tract was then gently curetted with the fistula probe device. The tract was irrigated with saline. The tract was then injected with Evicel. Excess evicel removed from the operative field. The anal area was then dressed with 4x4 gauze, ABD pad and mesh underwear. The patient was then awakened from anesthesia and transferred to a stretcher for transport to PACU in satisfactory condition   DISPOSITION:PACU in satisfactory condition

## 2017-10-15 ENCOUNTER — Encounter (HOSPITAL_BASED_OUTPATIENT_CLINIC_OR_DEPARTMENT_OTHER): Payer: Self-pay | Admitting: Surgery

## 2017-11-08 ENCOUNTER — Other Ambulatory Visit: Payer: Self-pay | Admitting: Internal Medicine

## 2017-11-08 DIAGNOSIS — R634 Abnormal weight loss: Secondary | ICD-10-CM

## 2017-11-18 ENCOUNTER — Ambulatory Visit
Admission: RE | Admit: 2017-11-18 | Discharge: 2017-11-18 | Disposition: A | Discharge status: Home / Self Care | Payer: Medicare Other | Source: Ambulatory Visit | Attending: Internal Medicine | Admitting: Internal Medicine

## 2017-11-18 ENCOUNTER — Other Ambulatory Visit: Payer: Self-pay

## 2017-11-18 DIAGNOSIS — R634 Abnormal weight loss: Secondary | ICD-10-CM

## 2017-11-18 MED ORDER — IOPAMIDOL (ISOVUE-300) INJECTION 61%: 75.0000 mL | Freq: Once | INTRAVENOUS | Status: DC | PRN

## 2017-11-18 MED ORDER — IOPAMIDOL (ISOVUE-300) INJECTION 61%
100.0000 mL | Freq: Once | INTRAVENOUS | Status: AC | PRN
  Administered 2017-11-18: 100 mL via INTRAVENOUS

## 2017-11-20 ENCOUNTER — Telehealth: Payer: Self-pay | Admitting: Cardiology

## 2017-11-20 DIAGNOSIS — E785 Hyperlipidemia, unspecified: Secondary | ICD-10-CM

## 2017-11-20 NOTE — Telephone Encounter (Signed)
In review of the chart - Dr Kevan Ny ordered pt's CT for the evaluation of abnormal weight loss.  Pt is calling to see if Dr Anne Fu will review the CT and give recommendations based on the results demonstrating diffuse coronary artery calcifications and scattered aortic calcifications.  Will forward this to Dr Anne Fu for review.

## 2017-11-20 NOTE — Telephone Encounter (Signed)
Spoke to patient who is waiting patiently for Dr Anne Fu' interpretation/recommendation of his Chest CT done at [facility].  I told him that we would call him when resulted.  He verbalized understanding.

## 2017-11-20 NOTE — Telephone Encounter (Signed)
New Messager    Patient is calling because he had a CT scan done at [facility]. He is wanting to see if [facility] can be contacted to obtain the imaging because they will not send it without [facility] contacting them. He states that it should that he had plaque around his heart. Please call to discuss.

## 2017-11-21 NOTE — Telephone Encounter (Signed)
Please have him come in to discuss. Would like to discuss starting statin. Donato SchultzMark Skains, MD

## 2017-11-22 NOTE — Telephone Encounter (Signed)
Spoke with patient who is aware to schedule an appt with Dr Anne FuSkains.  appt scheduled for 12/12/17.   He reports Dr Kevan NyGates has started him on a statin that he has not pick up yet.  Pt reports he has not had recent lipid profile.  Order placed for him to come in to have it checked here Monday.

## 2017-11-22 NOTE — Telephone Encounter (Signed)
Spoke with [place] ([place] who was driving and asked me to call the patient at [phone].  Called and left message for pt to call back to discuss scheduling a f/u appt.

## 2017-11-22 NOTE — Telephone Encounter (Signed)
Follow up  ° ° °Patient is returning your call. °

## 2017-11-25 ENCOUNTER — Other Ambulatory Visit: Payer: Medicare Other | Admitting: *Deleted

## 2017-11-25 ENCOUNTER — Encounter (INDEPENDENT_AMBULATORY_CARE_PROVIDER_SITE_OTHER): Payer: Self-pay

## 2017-11-25 DIAGNOSIS — E785 Hyperlipidemia, unspecified: Secondary | ICD-10-CM

## 2017-11-25 LAB — LIPID PANEL
CHOL/HDL RATIO: 3.7 ratio (ref 0.0–5.0)
CHOLESTEROL TOTAL: 197 mg/dL (ref 100–199)
HDL: 53 mg/dL (ref >39)
LDL CALC: 78 mg/dL (ref 0–99)
TRIGLYCERIDES: 331 mg/dL — AB (ref 0–149)
VLDL Cholesterol Cal: 66 mg/dL — ABNORMAL HIGH (ref 5–40)

## 2017-11-27 ENCOUNTER — Other Ambulatory Visit: Payer: Self-pay | Admitting: *Deleted

## 2017-11-27 MED ORDER — ATORVASTATIN CALCIUM 40 MG PO TABS
40.0000 mg | ORAL_TABLET | Freq: Every day | ORAL | 3 refills | Status: AC
Start: 2017-11-27 — End: ?

## 2017-11-28 ENCOUNTER — Encounter: Payer: Self-pay | Admitting: Cardiology

## 2017-12-12 ENCOUNTER — Ambulatory Visit: Payer: Medicare Other | Admitting: Cardiology

## 2017-12-12 ENCOUNTER — Encounter: Payer: Self-pay | Admitting: Cardiology

## 2017-12-12 ENCOUNTER — Encounter: Payer: Self-pay | Admitting: *Deleted

## 2017-12-12 VITALS — BP 124/62 | HR 53 | Ht 68.5 in | Wt 164.8 lb

## 2017-12-12 DIAGNOSIS — I10 Essential (primary) hypertension: Secondary | ICD-10-CM

## 2017-12-12 DIAGNOSIS — E785 Hyperlipidemia, unspecified: Secondary | ICD-10-CM | POA: Diagnosis not present

## 2017-12-12 DIAGNOSIS — Z79899 Other long term (current) drug therapy: Secondary | ICD-10-CM | POA: Diagnosis not present

## 2017-12-12 DIAGNOSIS — I2510 Atherosclerotic heart disease of native coronary artery without angina pectoris: Secondary | ICD-10-CM

## 2017-12-12 MED ORDER — OMEGA-3-ACID ETHYL ESTERS 1 G PO CAPS: 2.0000 g | ORAL_CAPSULE | Freq: Two times a day (BID) | ORAL | 11 refills | Status: AC

## 2017-12-12 MED ORDER — ASPIRIN EC 81 MG PO TBEC: 81.0000 mg | DELAYED_RELEASE_TABLET | Freq: Every day | ORAL | Status: AC

## 2017-12-12 NOTE — Progress Notes (Signed)
Cardiology Office Note:    Date:  12/12/2017   ID:  Adam, Mckenzie 1945-08-28, MRN [id number]  PCP:  Marden Noble, MD  Cardiologist:  No primary care provider on file.  Electrophysiologist:  None   Referring MD: Marden Noble, MD     History of Present Illness:    Adam Mckenzie is a 72 y.o. male with palpitations, PVCs, hyperlipidemia here for follow-up.  Echocardiogram 07/20/2016 showed normal ejection fraction grade 1 diastolic dysfunction.  Overall reassuring.  Prior EKG showed 3 PVCs personally reviewed.  Sinus rhythm heart rate 70.  No excessive caffeine use. We have gone back and forth with carvedilol and metoprolol to try to help blunt his palpitations.  Currently taking Toprol 25 mg twice a day.   Past Medical History:  Diagnosis Date  . Arthritis    osteoarthritis -hands  . GERD (gastroesophageal reflux disease)   . Hypertension   . Retinal detachment   . Tendonitis    left shoulder    Past Surgical History:  Procedure Laterality Date  . CATARACT EXTRACTION, BILATERAL    . COLONOSCOPY WITH PROPOFOL N/A 06/06/2015   Procedure: COLONOSCOPY WITH PROPOFOL;  Surgeon: Charolett Bumpers, MD;  Location: [facility];  Service: Endoscopy;  Laterality: N/A;  . ESOPHAGOGASTRODUODENOSCOPY (EGD) WITH PROPOFOL N/A 06/06/2015   Procedure: ESOPHAGOGASTRODUODENOSCOPY (EGD) WITH PROPOFOL;  Surgeon: Charolett Bumpers, MD;  Location: [facility];  Service: Endoscopy;  Laterality: N/A;  . EUS N/A 07/13/2015   Procedure: LOWER ENDOSCOPIC ULTRASOUND (EUS);  Surgeon: Willis Modena, MD;  Location: [facility];  Service: Endoscopy;  Laterality: N/A;  . EYE SURGERY  09/09/2013   retinal detachment surgery  . INCISION AND DRAINAGE ABSCESS N/A 10/14/2017   Procedure: POSSIBLE ANAL ABSCESS INCISION AND DRAINAGE, INJECTION OF FIBRIN GLUE INTO FISTULA TRACT;  Surgeon: Andria Meuse, MD;  Location: [facility];  Service: General;  Laterality: N/A;  . left shoulder  injection  10/07/13  . TONSILLECTOMY    . VASECTOMY    . VITRECTOMY Bilateral 2018    Current Medications: Current Meds  Medication Sig  . atorvastatin (LIPITOR) 40 MG tablet Take 1 tablet (40 mg total) by mouth daily.  Marland Kitchen losartan-hydrochlorothiazide (HYZAAR) 100-12.5 MG per tablet Take 0.5 tablets by mouth every morning.   . metoprolol succinate (TOPROL-XL) 25 MG 24 hr tablet Take 25 mg by mouth 2 (two) times daily.  . Omega-3 Fatty Acids (FISH OIL) 1000 MG CAPS Take 1,000 mg by mouth daily.  Marland Kitchen omeprazole (PRILOSEC) 20 MG capsule Take 10 mg by mouth every morning.   . TURMERIC PO Take 1 capsule by mouth daily.     Allergies:   Diltiazem and Hydrocodone   Social History   Socioeconomic History  . Marital status: Married    Spouse name: Not on file  . Number of children: Not on file  . Years of education: Not on file  . Highest education level: Not on file  Occupational History  . Not on file  Social Needs  . Financial resource strain: Not on file  . Food insecurity:    Worry: Not on file    Inability: Not on file  . Transportation needs:    Medical: Not on file    Non-medical: Not on file  Tobacco Use  . Smoking status: Never Smoker  . Smokeless tobacco: Never Used  Substance and Sexual Activity  . Alcohol use: Yes    Comment: 2-3 beers per day.  . Drug use:  No  . Sexual activity: Not on file  Lifestyle  . Physical activity:    Days per week: Not on file    Minutes per session: Not on file  . Stress: Not on file  Relationships  . Social connections:    Talks on phone: Not on file    Gets together: Not on file    Attends religious service: Not on file    Active member of club or organization: Not on file    Attends meetings of clubs or organizations: Not on file    Relationship status: Not on file  Other Topics Concern  . Not on file  Social History Narrative   Lives with wife.  Moved from [place] 1 year ago due to financial strain and living in house that  is in a trust for his brother who is in prison and being released later this year.  His mother lives in a house next door which is in a trust for him.  They are financially insecure.  His wife is teaching at [employer].  They lost a family friend a few weeks ago.  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       Family History: The patient's family history includes Alcoholism in his paternal uncle; Bipolar disorder in his brother; Depression in his mother; Heart disease in his father. There is no history of Stroke or Cancer.  ROS:   Please see the history of present illness.    Denies any fevers chills nausea vomiting syncope bleeding all other systems reviewed and are negative.  EKGs/Labs/Other Studies Reviewed:    The following studies were reviewed today: Prior echocardiogram, office notes, lab work, LDL 78 creatinine 0.9 hemoglobin 13.9  EKG:  EKG is not ordered today.  Previous EKG shows PVCs  Recent Labs: 10/14/2017: BUN 13; Creatinine, Ser 1.00; Hemoglobin 13.9; Potassium 4.2; Sodium 138  Recent Lipid Panel    Component Value Date/Time   CHOL 197 11/25/2017 1024   TRIG 331 (H) 11/25/2017 1024   HDL 53 11/25/2017 1024   CHOLHDL 3.7 11/25/2017 1024   CHOLHDL 3.3 10/09/2013 0254   VLDL 47 (H) 10/09/2013 0254   LDLCALC 78 11/25/2017 1024    Physical Exam:    VS:  BP 124/62   Pulse (!) 53   Ht 5' 8.5" (1.74 m)   Wt 164 lb 13.6 oz (74.8 kg)   SpO2 97%   BMI 24.70 kg/m     Wt Readings from Last 3 Encounters:  12/12/17 164 lb 13.6 oz (74.8 kg)  10/14/17 163 lb (73.9 kg)  05/31/17 172 lb (78 kg)     GEN:  Well nourished, well developed in no acute distress HEENT: Normal NECK: No JVD; No carotid bruits LYMPHATICS: No lymphadenopathy CARDIAC: RRR, no murmurs, rubs, gallops RESPIRATORY:  Clear to auscultation without rales, wheezing or rhonchi  ABDOMEN: Soft, non-tender, non-distended MUSCULOSKELETAL:  No edema; No deformity  SKIN: Warm  and dry NEUROLOGIC:  Alert and oriented x 3 PSYCHIATRIC:  Normal affect   ASSESSMENT:    1. Coronary artery disease involving native coronary artery of native heart without angina pectoris   2. Essential hypertension   3. Hyperlipidemia, unspecified hyperlipidemia type   4. Long-term use of high-risk medication    PLAN:    In order of problems listed above:  Coronary artery disease/diffuse, dense coronary artery calcification - From visible appearance, calcium score is greater than 400, dense diffuse LAD, circumflex, proximal RCA  consultation noted.  He is not having any chest pain or shortness of breath he states.  I would like to obtain a nuclear stress test however to make sure that he does not have any high risk evidence of ischemia.  I personally reviewed his CT scan with him. -Aggressive secondary risk factor prevention.  Aspirin 81.  Increase intensity of statin dose.  Now on atorvastatin milligrams once a day from 10 3 times a week.  Diet and exercise.  If he begins to have any anginal symptoms, further cardiac testing may be warranted.  Palpitations/PVCs - Previously has noted skipping every fourth or fifth time.  Has caused some anxiety in the past.  Normal EF.  Toprol XL 25 mg twice a day has helped.  50 mg once a day seem to be too strong he felt dizzy.  Fatigue - July 2015 hospitalized with transient global amnesia versus TIA versus narcotic effect.  Hyponatremia was noted but resolved.  Continue to exercise.  Mixed hyperlipidemia -Triglycerides 331 at last check.  Prior LDL 78.  Since the discovery of his three-vessel diffuse coronary artery disease/calcification noted on CT scan we have increased his atorvastatin to atorvastatin 40 mg.  He is also on fish oil as well.  I will prescribe him Lovaza 2 g twice daily.  Recheck lipid panel and ALT at the end of November.     Medication Adjustments/Labs and Tests Ordered: Current medicines are reviewed at length with the  patient today.  Concerns regarding medicines are outlined above.  Orders Placed This Encounter  Procedures  . ALT  . Lipid panel  . MYOCARDIAL PERFUSION IMAGING   Meds ordered this encounter  Medications  . aspirin EC 81 MG tablet    Sig: Take 1 tablet (81 mg total) by mouth daily.  Marland Kitchen omega-3 acid ethyl esters (LOVAZA) 1 g capsule    Sig: Take 2 capsules (2 g total) by mouth 2 (two) times daily.    Dispense:  120 capsule    Refill:  11    Needs to be Lovaza - not just omega 3    Patient Instructions  Medication Instructions:  Start Aspirin 81 mg a day. Please start Lovaza as directed. Continue all other medications as llisted  Labwork: Please have fasting blood work at the end of November. (Lipid/ALT)  Testing/Procedures: Your physician has requested that you have a myoview. For further information please visit [email or web address] Please follow instruction sheet, as given.  Follow-Up: Follow up in 6 months with Dr. Anne Fu.  You will receive a letter in the mail 2 months before you are due.  Please call us when you receive this letter to schedule your follow up appointment.  If you need a refill on your cardiac medications before your next appointment, please call your pharmacy.  Thank you for choosing [facility]!!        Signed, [name], MD  12/12/2017 12:15 PM    [facility]

## 2017-12-12 NOTE — Patient Instructions (Signed)
Medication Instructions:  Start Aspirin 81 mg a day. Please start Lovaza as directed. Continue all other medications as llisted  Labwork: Please have fasting blood work at the end of November. (Lipid/ALT)  Testing/Procedures: Your physician has requested that you have a myoview. For further information please visit [email or web address] Please follow instruction sheet, as given.  Follow-Up: Follow up in 6 months with Dr. Anne Fu.  You will receive a letter in the mail 2 months before you are due.  Please call us when you receive this letter to schedule your follow up appointment.  If you need a refill on your cardiac medications before your next appointment, please call your pharmacy.  Thank you for choosing [facility]!!

## 2017-12-18 ENCOUNTER — Telehealth (HOSPITAL_COMMUNITY): Payer: Self-pay | Admitting: *Deleted

## 2017-12-18 NOTE — Telephone Encounter (Signed)
Patient given detailed instructions per Myocardial Perfusion Study Information Sheet for the test on 12/23/17. Patient notified to arrive 15 minutes early and that it is imperative to arrive on time for appointment to keep from having the test rescheduled.  If you need to cancel or reschedule your appointment, please call the office within 24 hours of your appointment. . Patient verbalized understanding.Makiyla Linch Jacqueline    

## 2017-12-23 ENCOUNTER — Ambulatory Visit (HOSPITAL_COMMUNITY): Payer: Medicare Other | Attending: Cardiovascular Disease

## 2017-12-23 DIAGNOSIS — I10 Essential (primary) hypertension: Secondary | ICD-10-CM | POA: Insufficient documentation

## 2017-12-23 DIAGNOSIS — I2510 Atherosclerotic heart disease of native coronary artery without angina pectoris: Secondary | ICD-10-CM | POA: Insufficient documentation

## 2017-12-23 DIAGNOSIS — E785 Hyperlipidemia, unspecified: Secondary | ICD-10-CM | POA: Diagnosis not present

## 2017-12-23 LAB — MYOCARDIAL PERFUSION IMAGING
CHL CUP NUCLEAR SDS: 0
CHL CUP NUCLEAR SRS: 0
CHL CUP NUCLEAR SSS: 0
CSEPED: 13 min
CSEPEW: 13 METS
CSEPPHR: 122 {beats}/min
Exercise duration (sec): 20 s
LV dias vol: 104 mL (ref 62–150)
LVSYSVOL: 47 mL
MPHR: 150 {beats}/min
NUC STRESS TID: 1
Percent HR: 83 %
RPE: 19
Rest HR: 51 {beats}/min

## 2017-12-23 MED ORDER — TECHNETIUM TC 99M TETROFOSMIN IV KIT
32.4000 | PACK | Freq: Once | INTRAVENOUS | Status: AC | PRN
  Administered 2017-12-23: 32.4 via INTRAVENOUS
  Filled 2017-12-23: qty 33

## 2017-12-23 MED ORDER — REGADENOSON 0.4 MG/5ML IV SOLN
0.4000 mg | Freq: Once | INTRAVENOUS | Status: AC
  Administered 2017-12-23: 0.4 mg via INTRAVENOUS

## 2017-12-23 MED ORDER — TECHNETIUM TC 99M TETROFOSMIN IV KIT
10.3000 | PACK | Freq: Once | INTRAVENOUS | Status: AC | PRN
  Administered 2017-12-23: 10.3 via INTRAVENOUS
  Filled 2017-12-23: qty 11

## 2018-02-03 ENCOUNTER — Other Ambulatory Visit: Payer: Medicare Other | Admitting: *Deleted

## 2018-02-03 DIAGNOSIS — Z79899 Other long term (current) drug therapy: Secondary | ICD-10-CM

## 2018-02-03 DIAGNOSIS — E785 Hyperlipidemia, unspecified: Secondary | ICD-10-CM

## 2018-02-03 LAB — LIPID PANEL
CHOLESTEROL TOTAL: 203 mg/dL — AB (ref 100–199)
Chol/HDL Ratio: 3.1 ratio (ref 0.0–5.0)
HDL: 65 mg/dL (ref >39)
LDL Calculated: 106 mg/dL — ABNORMAL HIGH (ref 0–99)
TRIGLYCERIDES: 158 mg/dL — AB (ref 0–149)
VLDL CHOLESTEROL CAL: 32 mg/dL (ref 5–40)

## 2018-02-03 LAB — ALT: ALT: 35 IU/L (ref 0–44)

## 2018-02-25 ENCOUNTER — Ambulatory Visit: Payer: Self-pay | Admitting: Surgery

## 2018-02-25 NOTE — H&P (Deleted)
CC: Rectal cancer - f/u  HPI: [name] is a pleasant 69yoF with no known past medical history. For at least the last year she reports having had issues with hematochezia with each bowel movement. She denies whatsoever any abdominal pain, cramping, bloating, nausea, vomiting. She reports having normal bowel movements throughout all this. She has been tolerating a diet well. She was seen by Dr. Rhea Mckenzie for the hematochezia and taken for colonoscopy on 07/10/17 which was notable for a circumferential partially obstructing mass in the rectum approximately 5 cm from the anal verge that could not be traversed with a EGD scope. Biopsies returned adenocarcinoma. The area distal to this mass was tattooed. She reports issues with incontinence of her feces for at least the last 1-2 years and this has been getting worse over time. Her incontinence is primarily to liquid stool as well as gas. She wears a pad daily. She does not believe this significantly impacts her daily quality of life or changes her activities based on this, but experiences daily accidents. She reports her weight as being stable and has gained 3 lbs in the last month.  She was seen by one of my colleagues in medical oncology, Dr. Truett Mckenzie earlier this week and was quite frustrated with her new diagnosis of cancer as well as the amount of information being delivered to her regarding all of this. She left his office frustrated and declined follow-up the following day with Dr. Mitzi Mckenzie radiation oncology.  She underwent MRI pelvis 07/22/17 which demonstrated a locally advanced rectal cancer- [id number]. CT C/A/P 07/12/17 - showed a large rectal mass approximately 6 cm in length 5 cm of anorectal junction with multiple surrounding lymph nodes. Mildly enlarged right hilar lymph node not favored to be metastatic based on the radiology interpretation and other subcentimeter nodules which warrant surveillance  She completed Xeloda + XRT 09/19/17. She  tolerated this reasonably well. Since completing this, she reports improvement in some of her symptoms. She still does have some incontinent episodes to primarily liquid stool and accidents occurring once a week. She does occasionally also have incontinence to gas. She denies incontinence to solid stool. She does not wear a pad or diaper.  She completed her colonoscopy with Dr. Rhea Mckenzie preoperatively which demonstrated a nonobstructing mass in the proximal rectum that he measured 8 cm from the dentate. No bleeding was present. Distal to the mass tattoo was noted. There is additionally a 10 mm polyp in hepatic flexure that was removed piecemeal. Finally a polyp was found in descending colon and removed. Pathology revealed TAs  OR 11/20/17 - laparoscopic low anterior resection with coloproctostomy, diverting loop ileostomy, flexible sigmoidoscopy, cysto/stents. She recovered well postoperatively. She did initially have issues with high stoma output which kept her in the hospital a couple extra days. She had a small notable parastomal hernia at the time as well. She was discharged home. She returned to the emergency room 9/26 with a leaking stoma and one episode nausea/vomiting previously. She was seen in emergency room and noted to have a leukocytosis. She had no fevers. She had no abdominal pain. She was tolerating a diet without nausea or vomiting at that time. Her stoma was working. She underwent evaluation with a CT of the head as well as A/P which were largely unremarkable-she did have a small parastomal hernia but her ileostomy was productive and contrast was noted in the bag. Her chest x-ray demonstrated no evidence of pneumonia. Her urinalysis demonstrated no UTI. She was subsequently discharged  back home  Pathology returned invasive moderately differentiated colorectal adenocarcinoma 2.5 cm extending in the perirectal connective tissue, margins negative, 0 of 11 lymph nodes,  separate tubular adenoma 0.5 cm. LVI: No PNI: No  [id number]  Her case was presented at our multispecialty disparity report and the consensus was for her to proceed with adjuvant chemotherapy given her pretreatment and preoperative staging. She declined infusional agents and is therefore being given adjuvant capecitabine beginning 12/16/17  INTERVAL HX She has been on adjuvant capecitabine since 12/16/17 and tolerating reasonably well. She has noted some weight loss associated with the initiation of this medication. She reports a good appetite and has been eating pretty well. Her ostomy outputs have been no higher than 1050 cc in 24 hours. All she is currently taking his FiberCon and iron. She is no longer taking Imodium nor Lomotil. She denies a nausea or vomiting. Her ostomy output has been chief based in consistency. She does report some issues with applying her appliance as the size of his initially being cut for his now slightly too small. She does have a small amount of ileostomy prolapse as well. From this, she has developed some skin irritation superior to her ileostomy.   PMH: Denies  PSH: Open appendectomy - RLQ Laparoscopic cholecystectomy BTL-low midline incision Hysterectomy-TVH-denies prior radiation but this was done for cervical cancer Shoulder surgery Eye surgery  OB/Gyn: G4P3, all vaginal deliveries  FHx: Brother-prostate ca Mom-uterine ca Sister-gastric and ovarian ca Brother-lung cancer Daughter-breast ca  Social: Denies use of tobacco-one to one and a half packs per day for the last 40-42 years; denies use of alcohol or drugs. She is currently retired-has held numerous prior jobs, the most recent being it [employer]  ROS: A comprehensive 10 system review of systems was completed with the patient and pertinent findings as noted above  Hgb 04/2017 14.7 (from 14.3 12/2016) CEA 07/10/17: 1.9 CT C/A/P 07/12/17: Large rectal mass approximate 6 cm in length  located 5 cm from the anorectal junction with multiple surrounding perirectal/presacral lymph nodes likely representing local nodal metastatic spread. Mildly enlarged right hilar lymph node favored incisional by radiologist. 6 x 3 mm lingular nodule warranting surveillance.

## 2018-03-06 ENCOUNTER — Other Ambulatory Visit: Payer: Self-pay

## 2018-03-06 ENCOUNTER — Encounter (HOSPITAL_BASED_OUTPATIENT_CLINIC_OR_DEPARTMENT_OTHER): Payer: Self-pay | Admitting: *Deleted

## 2018-03-06 NOTE — Progress Notes (Signed)
Spoke w/ pt wife, Marylu Lundjanet, for pre-op interview.  Npo after mn.  Arrive at 0830.  Needs istat 8.  Current ekg in chart and epic.  Will take toprol, prilosec, and lipitor am dos w/ sips of water.

## 2018-03-20 ENCOUNTER — Ambulatory Visit (HOSPITAL_BASED_OUTPATIENT_CLINIC_OR_DEPARTMENT_OTHER)
Admission: RE | Admit: 2018-03-20 | Discharge: 2018-03-20 | Disposition: A | Discharge status: Home / Self Care | Payer: Medicare Other | Source: Ambulatory Visit | Attending: Surgery | Admitting: Surgery

## 2018-03-20 ENCOUNTER — Encounter (HOSPITAL_BASED_OUTPATIENT_CLINIC_OR_DEPARTMENT_OTHER): Payer: Self-pay | Admitting: Anesthesiology

## 2018-03-20 ENCOUNTER — Encounter (HOSPITAL_BASED_OUTPATIENT_CLINIC_OR_DEPARTMENT_OTHER)
Admission: RE | Disposition: A | Discharge status: Home / Self Care | Payer: Self-pay | Source: Ambulatory Visit | Attending: Surgery

## 2018-03-20 ENCOUNTER — Ambulatory Visit (HOSPITAL_BASED_OUTPATIENT_CLINIC_OR_DEPARTMENT_OTHER): Payer: Medicare Other | Admitting: Anesthesiology

## 2018-03-20 DIAGNOSIS — I10 Essential (primary) hypertension: Secondary | ICD-10-CM | POA: Diagnosis not present

## 2018-03-20 DIAGNOSIS — K219 Gastro-esophageal reflux disease without esophagitis: Secondary | ICD-10-CM | POA: Diagnosis not present

## 2018-03-20 DIAGNOSIS — E785 Hyperlipidemia, unspecified: Secondary | ICD-10-CM | POA: Insufficient documentation

## 2018-03-20 DIAGNOSIS — K603 Anal fistula: Secondary | ICD-10-CM | POA: Insufficient documentation

## 2018-03-20 DIAGNOSIS — Z79899 Other long term (current) drug therapy: Secondary | ICD-10-CM | POA: Diagnosis not present

## 2018-03-20 HISTORY — PX: PLACEMENT OF SETON: SHX6029

## 2018-03-20 HISTORY — PX: INCISION AND DRAINAGE ABSCESS: SHX5864

## 2018-03-20 HISTORY — DX: Anal fistula: K60.3

## 2018-03-20 HISTORY — DX: Ventricular premature depolarization: I49.3

## 2018-03-20 HISTORY — DX: Anal fistula, unspecified: K60.30

## 2018-03-20 HISTORY — DX: Cyst of kidney, acquired: N28.1

## 2018-03-20 HISTORY — DX: Personal history of other diseases of the digestive system: Z87.19

## 2018-03-20 HISTORY — DX: Personal history of other diseases of the nervous system and sense organs: Z86.69

## 2018-03-20 HISTORY — DX: Presence of spectacles and contact lenses: Z97.3

## 2018-03-20 HISTORY — DX: Atherosclerotic heart disease of native coronary artery without angina pectoris: I25.10

## 2018-03-20 HISTORY — DX: Unspecified osteoarthritis, unspecified site: M19.90

## 2018-03-20 LAB — POCT I-STAT, CHEM 8
BUN: 10 mg/dL (ref 8–23)
Calcium, Ion: 1.24 mmol/L (ref 1.15–1.40)
Chloride: 104 mmol/L (ref 98–111)
Creatinine, Ser: 1 mg/dL (ref 0.61–1.24)
Glucose, Bld: 82 mg/dL (ref 70–99)
HCT: 41 % (ref 39.0–52.0)
Hemoglobin: 13.9 g/dL (ref 13.0–17.0)
Potassium: 4 mmol/L (ref 3.5–5.1)
Sodium: 141 mmol/L (ref 135–145)
TCO2: 28 mmol/L (ref 22–32)

## 2018-03-20 SURGERY — EXAM UNDER ANESTHESIA
Anesthesia: Monitor Anesthesia Care

## 2018-03-20 MED ORDER — ACETAMINOPHEN 500 MG PO TABS
ORAL_TABLET | ORAL | Status: AC
  Filled 2018-03-20: qty 2

## 2018-03-20 MED ORDER — MIDAZOLAM HCL 2 MG/2ML IJ SOLN
INTRAMUSCULAR | Status: DC | PRN
  Administered 2018-03-20: 2 mg via INTRAVENOUS

## 2018-03-20 MED ORDER — SODIUM CHLORIDE 0.9 % IV SOLN
INTRAVENOUS | Status: DC | PRN
  Administered 2018-03-20: 10 ug/kg/min via INTRAVENOUS

## 2018-03-20 MED ORDER — BUPIVACAINE LIPOSOME 1.3 % IJ SUSP
INTRAMUSCULAR | Status: DC | PRN
  Administered 2018-03-20: 20 mL

## 2018-03-20 MED ORDER — BUPIVACAINE LIPOSOME 1.3 % IJ SUSP
20.0000 mL | Freq: Once | INTRAMUSCULAR | Status: DC
  Filled 2018-03-20: qty 20

## 2018-03-20 MED ORDER — ONDANSETRON HCL 4 MG/2ML IJ SOLN
INTRAMUSCULAR | Status: AC
  Filled 2018-03-20: qty 2

## 2018-03-20 MED ORDER — ACETAMINOPHEN 500 MG PO TABS
1000.0000 mg | ORAL_TABLET | ORAL | Status: AC
  Administered 2018-03-20: 1000 mg via ORAL
  Filled 2018-03-20: qty 2

## 2018-03-20 MED ORDER — DEXAMETHASONE SODIUM PHOSPHATE 10 MG/ML IJ SOLN
INTRAMUSCULAR | Status: AC
  Filled 2018-03-20: qty 1

## 2018-03-20 MED ORDER — ONDANSETRON HCL 4 MG/2ML IJ SOLN
4.0000 mg | Freq: Once | INTRAMUSCULAR | Status: DC | PRN
  Filled 2018-03-20: qty 2

## 2018-03-20 MED ORDER — LACTATED RINGERS IV SOLN
INTRAVENOUS | Status: DC
  Administered 2018-03-20: 10:00:00 via INTRAVENOUS
  Filled 2018-03-20: qty 1000

## 2018-03-20 MED ORDER — PROPOFOL 500 MG/50ML IV EMUL
INTRAVENOUS | Status: DC | PRN
  Administered 2018-03-20: 200 ug/kg/min via INTRAVENOUS

## 2018-03-20 MED ORDER — BUPIVACAINE-EPINEPHRINE 0.25% -1:200000 IJ SOLN
INTRAMUSCULAR | Status: DC | PRN
  Administered 2018-03-20: 30 mL

## 2018-03-20 MED ORDER — GABAPENTIN 300 MG PO CAPS
300.0000 mg | ORAL_CAPSULE | ORAL | Status: AC
  Administered 2018-03-20: 300 mg via ORAL
  Filled 2018-03-20: qty 1

## 2018-03-20 MED ORDER — PROPOFOL 500 MG/50ML IV EMUL
INTRAVENOUS | Status: AC
  Filled 2018-03-20: qty 50

## 2018-03-20 MED ORDER — FENTANYL CITRATE (PF) 100 MCG/2ML IJ SOLN
INTRAMUSCULAR | Status: AC
  Filled 2018-03-20: qty 2

## 2018-03-20 MED ORDER — TRAMADOL HCL 50 MG PO TABS: 50.0000 mg | ORAL_TABLET | Freq: Four times a day (QID) | ORAL | 0 refills | Status: AC | PRN

## 2018-03-20 MED ORDER — CHLORHEXIDINE GLUCONATE CLOTH 2 % EX PADS
6.0000 | MEDICATED_PAD | Freq: Once | CUTANEOUS | Status: DC
  Filled 2018-03-20: qty 6

## 2018-03-20 MED ORDER — FENTANYL CITRATE (PF) 100 MCG/2ML IJ SOLN
25.0000 ug | INTRAMUSCULAR | Status: DC | PRN
  Filled 2018-03-20: qty 1

## 2018-03-20 MED ORDER — MIDAZOLAM HCL 2 MG/2ML IJ SOLN
INTRAMUSCULAR | Status: AC
  Filled 2018-03-20: qty 2

## 2018-03-20 MED ORDER — KETAMINE HCL 10 MG/ML IJ SOLN
INTRAMUSCULAR | Status: AC
  Filled 2018-03-20: qty 1

## 2018-03-20 MED ORDER — GABAPENTIN 300 MG PO CAPS
ORAL_CAPSULE | ORAL | Status: AC
  Filled 2018-03-20: qty 1

## 2018-03-20 MED ORDER — LIDOCAINE 2% (20 MG/ML) 5 ML SYRINGE
INTRAMUSCULAR | Status: AC
  Filled 2018-03-20: qty 5

## 2018-03-20 MED ORDER — FENTANYL CITRATE (PF) 100 MCG/2ML IJ SOLN
INTRAMUSCULAR | Status: DC | PRN
  Administered 2018-03-20: 25 ug via INTRAVENOUS

## 2018-03-20 SURGICAL SUPPLY — 59 items
BENZOIN TINCTURE PRP APPL 2/3 (GAUZE/BANDAGES/DRESSINGS) ×6 IMPLANT
BLADE EXTENDED COATED 6.5IN (ELECTRODE) IMPLANT
BLADE HEX COATED 2.75 (ELECTRODE) ×3 IMPLANT
BLADE SURG 10 STRL SS (BLADE) IMPLANT
BLADE SURG 15 STRL LF DISP TIS (BLADE) ×1 IMPLANT
BLADE SURG 15 STRL SS (BLADE) ×2
BRIEF STRETCH FOR OB PAD LRG (UNDERPADS AND DIAPERS) ×6 IMPLANT
CANISTER SUCT 3000ML PPV (MISCELLANEOUS) ×3 IMPLANT
COVER BACK TABLE 60X90IN (DRAPES) ×3 IMPLANT
COVER MAYO STAND STRL (DRAPES) ×3 IMPLANT
COVER WAND RF STERILE (DRAPES) ×6 IMPLANT
DECANTER SPIKE VIAL GLASS SM (MISCELLANEOUS) ×3 IMPLANT
DRAPE LAPAROTOMY 100X72 PEDS (DRAPES) ×3 IMPLANT
DRAPE UTILITY XL STRL (DRAPES) ×3 IMPLANT
DRSG PAD ABDOMINAL 8X10 ST (GAUZE/BANDAGES/DRESSINGS) ×2 IMPLANT
GAUZE SPONGE 4X4 12PLY STRL (GAUZE/BANDAGES/DRESSINGS) ×3 IMPLANT
GLOVE BIO SURGEON STRL SZ7.5 (GLOVE) ×3 IMPLANT
GLOVE INDICATOR 8.0 STRL GRN (GLOVE) ×3 IMPLANT
GOWN STRL REUS W/TWL LRG LVL3 (GOWN DISPOSABLE) ×3 IMPLANT
HYDROGEN PEROXIDE 16OZ (MISCELLANEOUS) IMPLANT
IV CATH 14GX2 1/4 (CATHETERS) IMPLANT
IV CATH 18G SAFETY (IV SOLUTION) IMPLANT
KIT TURNOVER CYSTO (KITS) ×3 IMPLANT
LOOP VESSEL MAXI BLUE (MISCELLANEOUS) ×2 IMPLANT
NDL SAFETY ECLIPSE 18X1.5 (NEEDLE) IMPLANT
NEEDLE HYPO 18GX1.5 SHARP (NEEDLE)
NEEDLE HYPO 22GX1.5 SAFETY (NEEDLE) ×3 IMPLANT
NS IRRIG 500ML POUR BTL (IV SOLUTION) ×3 IMPLANT
PACK BASIN DAY SURGERY FS (CUSTOM PROCEDURE TRAY) ×3 IMPLANT
PAD ABD 8X10 STRL (GAUZE/BANDAGES/DRESSINGS) ×3 IMPLANT
PAD ARMBOARD 7.5X6 YLW CONV (MISCELLANEOUS) IMPLANT
PENCIL BUTTON HOLSTER BLD 10FT (ELECTRODE) ×3 IMPLANT
SPONGE HEMORRHOID 8X3CM (HEMOSTASIS) IMPLANT
SPONGE SURGIFOAM ABS GEL 12-7 (HEMOSTASIS) IMPLANT
SUCTION FRAZIER HANDLE 10FR (MISCELLANEOUS)
SUCTION TUBE FRAZIER 10FR DISP (MISCELLANEOUS) IMPLANT
SUT CHROMIC 2 0 SH (SUTURE) IMPLANT
SUT CHROMIC 3 0 SH 27 (SUTURE) IMPLANT
SUT MNCRL AB 4-0 PS2 18 (SUTURE) IMPLANT
SUT SILK 0 PSL NDL (SUTURE) IMPLANT
SUT SILK 0 TIES 10X30 (SUTURE) IMPLANT
SUT SILK 2 0 (SUTURE) ×2
SUT SILK 2-0 18XBRD TIE 12 (SUTURE) IMPLANT
SUT VIC AB 2-0 SH 27 (SUTURE) ×2
SUT VIC AB 2-0 SH 27XBRD (SUTURE) IMPLANT
SUT VIC AB 3-0 SH 18 (SUTURE) IMPLANT
SUT VIC AB 3-0 SH 27 (SUTURE)
SUT VIC AB 3-0 SH 27X BRD (SUTURE) IMPLANT
SUT VIC AB 3-0 SH 27XBRD (SUTURE) IMPLANT
SUT VIC AB 4-0 P-3 18XBRD (SUTURE) IMPLANT
SUT VIC AB 4-0 P3 18 (SUTURE)
SYR 20CC LL (SYRINGE) ×2 IMPLANT
SYR BULB IRRIGATION 50ML (SYRINGE) ×3 IMPLANT
SYR CONTROL 10ML LL (SYRINGE) ×3 IMPLANT
TOWEL OR 17X24 6PK STRL BLUE (TOWEL DISPOSABLE) ×3 IMPLANT
TRAY DSU PREP LF (CUSTOM PROCEDURE TRAY) ×3 IMPLANT
TUBE CONNECTING 12'X1/4 (SUCTIONS) ×1
TUBE CONNECTING 12X1/4 (SUCTIONS) ×2 IMPLANT
YANKAUER SUCT BULB TIP NO VENT (SUCTIONS) ×3 IMPLANT

## 2018-03-20 NOTE — Anesthesia Postprocedure Evaluation (Signed)
Anesthesia Post Note  Patient: Adam Mckenzie  Procedure(s) Performed: ANORECTAL EXAM UNDER ANESTHESIA (N/A ) POSSIBLE PLACEMENT OF SETON (N/A ) POSSIBLE INCISION AND DRAINAGE/DEBRIDEMENT (N/A )     Patient location during evaluation: PACU Anesthesia Type: MAC Level of consciousness: awake and alert Pain management: pain level controlled Vital Signs Assessment: post-procedure vital signs reviewed and stable Respiratory status: spontaneous breathing, nonlabored ventilation, respiratory function stable and patient connected to nasal cannula oxygen Cardiovascular status: stable and blood pressure returned to baseline Postop Assessment: no apparent nausea or vomiting Anesthetic complications: no    Last Vitals:  Vitals:   03/20/18 1130 03/20/18 1238  BP: 127/79 (!) 160/77  Pulse: (!) 57 (!) 55  Resp: 11 14  Temp:  36.8 C  SpO2: 97% 100%    Last Pain:  Vitals:   03/20/18 1230  TempSrc:   PainSc: 0-No pain                 Dorion Petillo P Ehren Berisha

## 2018-03-20 NOTE — H&P (Signed)
H&P UPDATE  H&P from the office dated 02/18/18 was reviewed by myself with the patient. He reports no interval change in his health or history. He denies any new complaints today. He states he is ready for his procedure.  Vitals:   03/06/18 1200 03/20/18 0843  BP:  (!) 143/77  Pulse:  (!) 56  Resp:  16  Temp:  99.2 F (37.3 C)  TempSrc:  Oral  SpO2:  100%  Weight: 79.4 kg 74.1 kg  Height: 5\' 8"  (1.727 m) 5\' 8"  (1.727 m)   Gen: NAD, comfortable CV: RRR GI: Abdomen is soft, NT/ND Ext: No pitting edema/swelling  Mr. 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 Here with persistent/recurrent wound in this area  -The anatomy and physiology of the anal canal was discussed at length with the patient. The pathophysiology of anal abscess and fistula was discussed at length with associated pictures and illustrations. Given the persistence of drainage, we discussed it is probable that he has a fistula at this point. -He is concerned about incontinence and would like to avoid any division of sphincter muscle at all possible. We discussed anorectal exam under anesthesia and possible fistulotomy if no muscles involved, possible seton - including potential cutting seton. If no communication is found with the internal anal canal, we discussed opening the wound and debriding it and allowing it to close by secondary intention. -The planned procedure, material risks (including, but not limited to, pain, bleeding, infection, scarring, need for blood transfusion, damage to anal sphincter, worsening incontinence of gas and/or stool, need for additional procedures, recurrence/persistence, pneumonia, heart attack, stroke, death) benefits and alternatives to surgery were discussed at length. The patient's  questions were answered to his satisfaction, he voiced understanding and elected to proceed with surgery. Additionally, we discussed typical postoperative expectations and the recovery process.  Stephanie Coup. Cliffton Asters, M.D. General and Colorectal Surgery [facility]

## 2018-03-20 NOTE — H&P (Signed)
CC: Here for f/u - persistent posterior mildine drainage  HPI: Mr. Docken is a very pleasant 72yoM with hx of HTN, GERD who was seen by my partner, Dr. Luisa Hart for an anal wound. 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 Per the operative note, the fistula involve the external sphincter or part of it as well as part of the internal sphincter a small part of it. A primary fistulotomy was performed. Following this he had a wound is fully granulated and has had a pinpoint size posterior midline wound that opens up every 3 months and has some bloody material drain since his procedure. He has been reading a textbook on wound care and has been applying a silver medicine cream to it. He has previously tried silver nitrate to the granulating area but the wound continues to open every 3 months. He denies any significant pain in that area. He denies any problems with incontinence to gas, liquid stool, or solid stool. He denies any purulent drainage from the wound.  He states he is up-to-date with his colonoscopies and had one 2 years ago and has never been told any polyps or other concerning findings. He denies any known history of inflammatory bowel disease. He denies any abdominal pain. He denies any fevers or chills.  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 No associated abscess.  In the office, he reported some issues with some gas and occasionally liquid stool incontinence. He denied wearing a pad or diaper. He denied incontinence to solid stool. He describes his accidents as being a smear of stool in 5-6 hours after a bowel movement particularly with loose.  INTERVAL HX He was taken to the OR 10/14/17 for EUA - sinus tract found but no clear fistulous communication could  be identified to the anal canal. This was therefore curretted and granulation tissue removed. The tract was then injected with Evicel as planned previously given his concerns about incontinence. He reports he recovered well from this.  For the past couple of months, he has had a small amount of clear/yellow drainage from the posterior midline that has persisted. He denies any fever or chills or recurrent perianal pain. He still had issues with incontinence occasionally to liquid stool but does not wear a pad or diaper. He elected to the bottom of why he has a persistent draining area  PMH: HTN, GERD  PSH: Fistulotomy 05/2016 with Dr. Luisa Hart  FHx: Denies FHx of malignancy  Social: Denies use of tobacco/drugs; social EtOH use. Works in Education officer, environmental  ROS: A comprehensive 10 system review of systems was completed with the patient and pertinent findings as noted above.  The patient is a 73 year old male.   Allergies Judithann Sauger, [place]; 02/18/2018 11:28 AM) HYDROcodone Bitartrate *CHEMICALS*  Allergies Reconciled   Medication History Judithann Sauger, RMA; 02/18/2018 11:28 AM) PrednisoLONE Acetate (1% Suspension, Ophthalmic) Active. ClonazePAM (1MG  Tablet, Oral) Active. Metoprolol Succinate ER (25MG  Tablet ER 24HR, Oral) Active. Losartan Potassium (100MG  Tablet, Oral) Active. Losartan Potassium-HCTZ (100-12.5MG  Tablet, Oral daily) Active. PriLOSEC (20MG  Capsule DR, Oral daily) Active. Omega-3-acid Ethyl Esters (1GM Capsule, Oral) Active. traZODone HCl (50MG  Tablet, Oral) Active. Medications Reconciled    Review of Systems Cristal Deer M. Jerimah Witucki MD; 02/18/2018 11:41 AM) General Not Present- Appetite Loss, Chills, Fatigue, Fever, Night Sweats,  Weight Gain and Weight Loss. Skin Not Present- Change in Wart/Mole, Dryness, Hives, Jaundice, New Lesions, Non-Healing Wounds, Rash and Ulcer. HEENT Not Present- Earache, Hearing Loss, Hoarseness, Nose Bleed, Oral Ulcers, Ringing in the  Ears, Seasonal Allergies, Sinus Pain, Sore Throat, Visual Disturbances, Wears glasses/contact lenses and Yellow Eyes. Respiratory Not Present- Bloody sputum, Chronic Cough, Difficulty Breathing, Snoring and Wheezing. Breast Not Present- Breast Mass, Breast Pain, Nipple Discharge and Skin Changes. Cardiovascular Not Present- Chest Pain, Difficulty Breathing Lying Down, Leg Cramps, Palpitations, Rapid Heart Rate, Shortness of Breath and Swelling of Extremities. Gastrointestinal Not Present- Abdominal Pain, Bloating, Bloody Stool, Change in Bowel Habits, Chronic diarrhea, Constipation, Difficulty Swallowing, Excessive gas, Gets full quickly at meals, Hemorrhoids, Indigestion, Nausea, Rectal Pain and Vomiting. Male Genitourinary Not Present- Blood in Urine, Change in Urinary Stream, Frequency, Impotence, Nocturia, Painful Urination, Urgency and Urine Leakage. Neurological Not Present- Decreased Memory and Difficulty Speaking. Psychiatric Not Present- Anxiety and Depression. Hematology Not Present- Blood Clots and Blood Thinners.  Vitals Judithann Sauger(Patricia King RMA; 02/18/2018 11:28 AM) 02/18/2018 11:27 AM Weight: 165.4 lb Height: 68in Body Surface Area: 1.89 m Body Mass Index: 25.15 kg/m  Temp.: 97.16F  Pulse: 92 (Regular)  BP: 110/70 (Sitting, Left Arm, Standard)       Physical Exam Cristal Deer(Waldon Sheerin M. Janeah Kovacich MD; 02/18/2018 11:43 AM) The physical exam findings are as follows: Note:Constitutional: No acute distress; conversant; no deformities Eyes: Moist conjunctiva; no lid lag; anicteric sclerae; pupils equal round and reactive to light Neck: Trachea midline; no palpable thyromegaly Lungs: Normal respiratory effort; no tactile fremitus CV: Regular rate and rhythm; no palpable thrill; no pitting edema GI: Abdomen soft, nontender, nondistended; no palpable hepatosplenomegaly Anorectal: Punctate posterior midline opening consistent with possible fistula. No fluctuance or erythema. No  tenderness. There is scarring tracking towards the anal sphincter consistent with prior surgery. DRE-good tone. No palpable masses. Anoscopy: Small 3 column internal hemorrhoids- RA/RP/LL; no definitive internal opening visualized. Anal canal is smooth and normal in appearance. MSK: Normal gait; no clubbing/cyanosis Psychiatric: Appropriate affect; alert and oriented 3 Lymphatic: No palpable cervical or axillary lymphadenopathy    Assessment & Plan Cristal Deer(Johntavius Shepard M. Tavyn Kurka MD; 02/18/2018 11:45 AM) ANAL FISTULA (K60.3) Story: Mr. 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 Here with persistent/recurrent wound in this area  -The anatomy and physiology of the anal canal was discussed at length with the patient. The pathophysiology of anal abscess and fistula was discussed at length with associated pictures and illustrations. Given the persistence of drainage, we discussed it is probable that he has a fistula at this point. -He is concerned about incontinence and would like to avoid any division of sphincter muscle at all possible. We discussed anorectal exam under anesthesia and possible fistulotomy if no muscles involved, possible seton. If note medication is found internal anal canal, we discussed opening the wound and debriding it and allowing it to close by secondary intention. -The planned procedure, material risks (including, but not limited to, pain, bleeding, infection, scarring, need for blood transfusion, damage to anal sphincter, worsening incontinence of gas and/or stool, need for additional procedures, recurrence, pneumonia, heart attack, stroke, death) benefits and alternatives to surgery were discussed at length. The patient's questions were answered to his satisfaction, he voiced  understanding and elected to proceed with surgery. Additionally,  we discussed typical postoperative expectations and the recovery process.

## 2018-03-20 NOTE — Discharge Instructions (Addendum)
ANORECTAL SURGERY: POST OP INSTRUCTIONS  A cutting seton was placed in the fistula identified. This is designed to slowly cut through the fistula tract - over time. Occasionally patients require an additional procedure in the coming weeks to "tighten" the seton. No specific wound care needs to be performed aside from placing a dressing over it to protect your underwear from some drainage.  1. DIET: Follow a light bland diet the first 24 hours after arrival home, such as soup, liquids, crackers, etc.  Be sure to include lots of fluids daily.  Avoid fast food or heavy meals as your are more likely to get nauseated.  Eat a low fat diet the next few days after surgery.    2. Take your usually prescribed home medications unless otherwise directed.  3. PAIN CONTROL: a. It is helpful to take an over-the-counter pain medication regularly for the first few days/weeks.  Choose from the following that works best for you: i. Ibuprofen (Advil, etc) Three 200mg  tabs every 6 hours as needed. ii. Acetaminophen (Tylenol, etc) 500-650mg  every 6 hours as needed iii. NOTE: You may take both of these medications together - most patients find it most helpful when alternating between the two (i.e. Ibuprofen at 6am, tylenol at 9am, ibuprofen at 12pm ...) b. A  prescription for pain medication may have been prescribed for you at discharge.  Take your pain medication as prescribed.  i. If you are having problems/concerns with the prescription medicine, please call us for further advice.  4. Avoid getting constipated.  Between the surgery and the pain medications, it is common to experience some constipation.  Increasing fluid intake (64oz of water per day) and taking a fiber supplement (such as Metamucil, Citrucel, FiberCon) 1-2 times a day regularly will usually help prevent this problem from occurring.  Take Miralax (over the counter) 1-2x/day while taking a narcotic pain medication. If no bowel movement after 48hours, you  may additionally take a laxative like a bottle of Milk of Magnesia which can be purchased over the counter. Avoid enemas if possible as these are often painful.   5. Watch out for diarrhea.  If you have many loose bowel movements, simplify your diet to bland foods.  Stop any stool softeners and decrease your fiber supplement. If this worsens or does not improve, please call us.  6. Wash / shower every day.  If you were discharged with a dressing, you may remove this the day after your surgery. You may shower normally, getting soap/water on your wound, particularly after bowel movements.  7. Soaking in a warm bath filled a couple inches ("Sitz bath") is a great way to clean the area after a bowel movement and many patients find it is a way to soothe the area.  8. ACTIVITIES as tolerated:   a. You may resume regular (light) daily activities beginning the next day--such as daily self-care, walking, climbing stairs--gradually increasing activities as tolerated.  If you can walk 30 minutes without difficulty, it is safe to try more intense activity such as jogging, treadmill, bicycling, low-impact aerobics, etc. b. Refrain from any heavy lifting or straining for the first 2 weeks after your procedure, particularly if your surgery was for hemorrhoids. c. Avoid activities that make your pain worse d. You may drive when you are no longer taking prescription pain medication, you can comfortably wear a seatbelt, and you can safely maneuver your car and apply brakes.  9. FOLLOW UP in our office a. Please call [facility] at [phone] to set up an appointment to see your surgeon in the office for a follow-up appointment approximately 2 weeks after your surgery. b. Make sure that you call for this appointment the day you arrive home to insure a convenient appointment time.  9. If you have disability or family leave forms that need to be completed, you may have them completed by your primary care physician's  office; for return to work instructions, please ask our office staff and they will be happy to assist you in obtaining this documentation   When to call us [phone]: 1. Poor pain control 2. Reactions / problems with new medications (rash/itching, etc)  3. Fever over 101.5 F (38.5 C) 4. Inability to urinate 5. Nausea/vomiting 6. Worsening swelling or bruising 7. Continued bleeding from incision. 8. Increased pain, redness, or drainage from the incision  The clinic staff is available to answer your questions during regular business hours (8:30am-5pm).  Please dont hesitate to call and ask to speak to one of our nurses for clinical concerns.   A surgeon from [facility] is always on call at the hospitals   If you have a medical emergency, go to the nearest emergency room or call 911.   [facility] [address], [address], [place], [place]  [id number] ? MAIN: [phone] FAX [phone] [email or web address] Information for Discharge Teaching: EXPAREL (bupivacaine liposome injectable suspension)   Your surgeon or anesthesiologist gave you EXPAREL(bupivacaine) to help control your pain after surgery.   EXPAREL is a local anesthetic that provides pain relief by numbing the tissue around the surgical site.  EXPAREL is designed to release pain medication over time and can control pain for up to 72 hours.  Depending on how you respond to EXPAREL, you may require less pain medication during your recovery.  Possible side effects:  Temporary loss of sensation or ability to move in the area where bupivacaine was injected.  Nausea, vomiting, constipation  Rarely, numbness and tingling in your mouth or lips, lightheadedness, or anxiety may occur.  Call your doctor right away if you think you may be experiencing any of these sensations, or if you have other questions regarding possible side effects.  Follow all other discharge  instructions given to you by your surgeon or nurse. Eat a healthy diet and drink plenty of water or other fluids.  If you return to the hospital for any reason within 96 hours following the administration of EXPAREL, it is important for health care providers to know that you have received this anesthetic. A teal colored band has been placed on your arm with the date, time and amount of EXPAREL you have received in order to alert and inform your health care providers. Please leave this armband in place for the full 96 hours following administration, and then you may remove the band. Post Anesthesia Home Care Instructions  Activity: Get plenty of rest for the remainder of the day. A responsible individual must stay with you for 24 hours following the procedure.  For the next 24 hours, DO NOT: -Drive a car -Advertising copywriterperate machinery -Drink alcoholic beverages -Take any medication unless instructed by your physician -Make any legal decisions or sign important papers.  Meals: Start with liquid foods such as gelatin or soup. Progress to regular foods as tolerated. Avoid greasy, spicy, heavy foods. If nausea and/or vomiting occur, drink only clear liquids until the nausea and/or vomiting subsides. Call your physician if vomiting continues.  Special  Instructions/Symptoms: Your throat may feel dry or sore from the anesthesia or the breathing tube placed in your throat during surgery. If this causes discomfort, gargle with warm salt water. The discomfort should disappear within 24 hours.  If you had a scopolamine patch placed behind your ear for the management of post- operative nausea and/or vomiting:  1. The medication in the patch is effective for 72 hours, after which it should be removed.  Wrap patch in a tissue and discard in the trash. Wash hands thoroughly with soap and water. 2. You may remove the patch earlier than 72 hours if you experience unpleasant side effects which may include dry mouth,  dizziness or visual disturbances. 3. Avoid touching the patch. Wash your hands with soap and water after contact with the patch.

## 2018-03-20 NOTE — Anesthesia Preprocedure Evaluation (Addendum)
Anesthesia Evaluation  Patient identified by MRN, date of birth, ID band Patient awake    Reviewed: Allergy & Precautions, NPO status , Patient's Chart, lab work & pertinent test results  Airway Mallampati: II  TM Distance: >3 FB Neck ROM: Full    Dental  (+) Teeth Intact, Dental Advisory Given, Chipped,    Pulmonary neg pulmonary ROS,    Pulmonary exam normal        Cardiovascular hypertension, Pt. on medications and Pt. on home beta blockers + CAD   Rhythm:Regular Rate:Normal  ECG: rate 59. Sinus bradycardia with occasional Premature ventricular complexes  Myocardial perfusion Nuclear stress EF: 55%. There was no ST segment deviation noted during stress. The study is normal. This is a low risk study. The left ventricular ejection fraction is normal (55-65%). Normal resting and stress perfusion. No ischemia or infarction EF 55%   Neuro/Psych negative neurological ROS  negative psych ROS   GI/Hepatic Neg liver ROS, GERD  Medicated and Controlled,  Endo/Other  negative endocrine ROS  Renal/GU negative Renal ROS     Musculoskeletal negative musculoskeletal ROS (+)   Abdominal   Peds  Hematology HLD   Anesthesia Other Findings anal fistula  Reproductive/Obstetrics                         Anesthesia Physical Anesthesia Plan  ASA: II  Anesthesia Plan: MAC   Post-op Pain Management:    Induction: Intravenous  PONV Risk Score and Plan: 2 and Ondansetron, Dexamethasone, Treatment may vary due to age or medical condition and Propofol infusion  Airway Management Planned: Nasal Cannula  Additional Equipment:   Intra-op Plan:   Post-operative Plan:   Informed Consent: I have reviewed the patients History and Physical, chart, labs and discussed the procedure including the risks, benefits and alternatives for the proposed anesthesia with the patient or authorized representative who has  indicated his/her understanding and acceptance.   Dental advisory given  Plan Discussed with: CRNA  Anesthesia Plan Comments:       Anesthesia Quick Evaluation

## 2018-03-20 NOTE — Transfer of Care (Signed)
Immediate Anesthesia Transfer of Care Note  Patient: Adam Mckenzie  Procedure(s) Performed: ANORECTAL EXAM UNDER ANESTHESIA (N/A ) POSSIBLE PLACEMENT OF SETON (N/A ) POSSIBLE INCISION AND DRAINAGE/DEBRIDEMENT (N/A )  Patient Location: PACU  Anesthesia Type:MAC  Level of Consciousness: awake, alert , oriented and patient cooperative  Airway & Oxygen Therapy: Patient Spontanous Breathing and Patient connected to nasal cannula oxygen  Post-op Assessment: Report given to RN and Post -op Vital signs reviewed and stable  Post vital signs: Reviewed and stable  Last Vitals:  Vitals Value Taken Time  BP 121/71 03/20/2018 10:57 AM  Temp    Pulse 66 03/20/2018 10:59 AM  Resp 14 03/20/2018 10:59 AM  SpO2 97 % 03/20/2018 10:59 AM  Vitals shown include unvalidated device data.  Last Pain:  Vitals:   03/20/18 0843  TempSrc: Oral         Complications: No apparent anesthesia complications

## 2018-03-20 NOTE — Anesthesia Procedure Notes (Signed)
Procedure Name: MAC Date/Time: 03/20/2018 10:02 AM Performed by: Wanita Chamberlain, CRNA Pre-anesthesia Checklist: Patient being monitored, Suction available, Emergency Drugs available and Patient identified Patient Re-evaluated:Patient Re-evaluated prior to induction Oxygen Delivery Method: Nasal cannula Induction Type: IV induction Placement Confirmation: breath sounds checked- equal and bilateral,  CO2 detector and positive ETCO2 Dental Injury: Teeth and Oropharynx as per pre-operative assessment

## 2018-03-20 NOTE — Op Note (Signed)
03/20/2018  10:48 AM  PATIENT:  Adam Mckenzie  73 y.o. male  Patient Care Team: Josetta Huddle, MD as PCP - General (Internal Medicine)  PRE-OPERATIVE DIAGNOSIS:  Persistent perianal wound, possible anal fistula  POST-OPERATIVE DIAGNOSIS:  Intersphincteric anal fistula  PROCEDURE:   1. Placement of cutting seton 2. Partial fistulotomy 3. Anorectal exam under anesthesia  SURGEON:  Surgeon(s): Ileana Roup, MD  ASSISTANT: OR staff   ANESTHESIA:   MAC  SPECIMEN:  No Specimen  DISPOSITION OF SPECIMEN:  N/A  COUNTS:  Sponge, needle and instrument counts were reported correct x2  PLAN OF CARE: Discharge to home after PACU  PATIENT DISPOSITION:  PACU - hemodynamically stable.  INDICATION: Mr. Granquist is a very pleasant 22yoM with hx of HTN, GERD who was seen by my partner, Dr. Brantley Stage for an anal wound. He was found to have a posterior midline anal fistula and was taken to the operating room for EUA and fistulotomy 05/2016. Per the operative note, the fistula involved the external sphincter or part of it as well as part of the internal sphincter. A primary fistulotomy was performed. Following this he had a wound that was fully granulated and had a pinpoint size posterior midline wound that opened up every 3 months and drained. He underwent MRI pelvis - this demonstrated an intersphincteric perianal fistula arising in the posterior midline and tracking caudally to the intergluteal fold. No associated abscess. Postoperatively he devloped some issues with fecal incontinence - smear of stool occurring in his underwear 5-6hrs after a BM but did not wear pad or diaper. He was taken to the OR 10/14/17 for anorectal exam under anesthesia; a posterior midline sinus tract found but no clear fistulous communication could be identified to the anal canal with gentle probing and injection of peroxide. This was therefore curretted and granulation tissue removed. The tract was then injected with  Evicel as planned previously given his concerns about incontinence. He was seen postoperatively and has had a small amount of clear/yellow drainage from the posterior midline that has persisted. A pinpoint opening was seen on exam. Options were discussed moving forward for possible recurrent/persistent occult anal fistula and he opted to undergo anorectal exam under anesthesia, possible incision/drainage, possible fistulotomy, possible seton placement. Please refer to H&P for details regarding this discussion  OR FINDINGS: Posterior midline punctate opening.  The tract was injected with dilute hydrogen peroxide and a very small internal opening was identified distal the dentate line.  All of this arose through his posterior midline fistulotomy site.  Site was controlled with a fistula probe and palpation revealed firm scar versus sphincter muscle.  The overlying anoderm was incised and a bundle of external sphincter muscle was found involving the tract. Given these findings, the decision was made to not pursue repeat fistulotomy but instead place a cutting seton  DESCRIPTION: The patient was identified in the preoperative holding area and taken to the OR where he was placed on the operating room table. SCDs were placed.  MAC anesthesia was induced without difficulty. The patient was then positioned in prone jackknife position with buttocks gently taped apart after hair was clipped and benzoin applied.  Pressure points were then padded.  The patient was then prepped and draped in usual sterile fashion.  A surgical timeout was performed indicating the correct patient, procedure, positioning.  A rectal block was performed using Marcaine with epinephrine and Exparel.    A punctate sized posterior midline opening was identified.  A digital  rectal exam was then performed and no palpable abnormalities were identified.  A Hill-Ferguson retractor was then placed and circumferential inspection noted internal  hemorrhoids.  The external opening of this possible fistulous tract was then injected with dilute mixture of peroxide.  A very small amount of bubbles were seen just beneath the mucosa in the posterior midline arising in the scar of the prior fistulotomy.  After some time a couple bubbles did exude.  This tract was then controlled with a lubricated fistula probe.  The area was palpated and due to the prior surgery and scar, it was unclear if this was a scar band or underlying muscle.  Given his prior history of incontinence, a repeat fistulotomy would only be performed if this was a scar band.  The overlying anoderm was incised with electrocautery.  External sphincter muscle was identified but not divided.  The intervening skin and subcutaneous tissue externally to the level of the external muscle was divided in a partial fistulotomy fashion.  Again, no muscle was divided during this process.  The fistula was then controlled with a blue vessel loop seton.  This was secured somewhat snugly to itself around the external sphincter bundle with 0 silk suture ties to create a cutting seton.  The anal canal was inspected and hemostasis was noted.  Additional anesthetic consisting of Marcaine and epinephrine with Exparel was infiltrated into the perianal tissues.  A dressing consisting of 4 x 4's, ABD, and mesh underwear were then placed.  The patient was then transferred back to a stretcher, awakened from anesthesia, and transported to PACU in satisfactory condition.  DISPOSITION: PACU in satisfactory condition.

## 2018-03-21 ENCOUNTER — Encounter (HOSPITAL_BASED_OUTPATIENT_CLINIC_OR_DEPARTMENT_OTHER): Payer: Self-pay | Admitting: Surgery

## 2018-06-04 ENCOUNTER — Telehealth: Payer: Self-pay | Admitting: Cardiology

## 2018-06-04 NOTE — Telephone Encounter (Signed)
   Left voicemail concerning rescheduling/postponing office visit for 06/06/2018 secondary to coronavirus.  I would like to postpone his visit if he is doing well for a Level 3 visit or greater than 3 months.  I asked him to call our clinic back [phone] if any questions.  [name], MD

## 2018-06-06 ENCOUNTER — Ambulatory Visit: Payer: Medicare Other | Admitting: Cardiology

## 2018-06-15 ENCOUNTER — Other Ambulatory Visit: Payer: Self-pay | Admitting: Cardiology

## 2018-06-18 ENCOUNTER — Telehealth: Payer: Self-pay

## 2018-06-18 NOTE — Telephone Encounter (Signed)
I called pt to offer him a Virtual visit with Georgie Chard (per Dr Anne Fu approval). He stated that he was feeling ok and didn't think he needed to be seen at this time. He recently had labs done by his PCP Marden Noble, MD) and would like for Dr Anne Fu to look over those. He is moving out of the [place] in [place] and would like to see Dr Anne Fu before he moves. He would like for someone to call him if and when the schedule is open and he is hoping to see him before he moves away.  I removed him from the call back pool.

## 2018-06-18 NOTE — Telephone Encounter (Signed)
Noted - will have Dr Anne Fu review labs once they are received from PCP.  Pt placed in recall for f/u
